# Patient Record
Sex: Female | Born: 1950
Health system: Southern US, Community
[De-identification: ages and names within clinical notes are randomized; demographics above are authoritative.]

## PROBLEM LIST (undated history)

## (undated) DIAGNOSIS — D649 Anemia, unspecified: Secondary | ICD-10-CM

## (undated) DIAGNOSIS — N939 Abnormal uterine and vaginal bleeding, unspecified: Secondary | ICD-10-CM

## (undated) DIAGNOSIS — N6452 Nipple discharge: Secondary | ICD-10-CM

## (undated) DIAGNOSIS — R7611 Nonspecific reaction to tuberculin skin test without active tuberculosis: Secondary | ICD-10-CM

## (undated) DIAGNOSIS — M8580 Other specified disorders of bone density and structure, unspecified site: Secondary | ICD-10-CM

## (undated) DIAGNOSIS — I499 Cardiac arrhythmia, unspecified: Secondary | ICD-10-CM

## (undated) DIAGNOSIS — E785 Hyperlipidemia, unspecified: Secondary | ICD-10-CM

## (undated) DIAGNOSIS — Z9289 Personal history of other medical treatment: Secondary | ICD-10-CM

## (undated) DIAGNOSIS — D219 Benign neoplasm of connective and other soft tissue, unspecified: Secondary | ICD-10-CM

## (undated) DIAGNOSIS — M1990 Unspecified osteoarthritis, unspecified site: Secondary | ICD-10-CM

## (undated) DIAGNOSIS — D0330 Melanoma in situ of unspecified part of face: Secondary | ICD-10-CM

## (undated) DIAGNOSIS — G5603 Carpal tunnel syndrome, bilateral upper limbs: Secondary | ICD-10-CM

## (undated) DIAGNOSIS — F419 Anxiety disorder, unspecified: Secondary | ICD-10-CM

## (undated) HISTORY — PX: SKIN BIOPSY: SHX1

## (undated) HISTORY — DX: Abnormal uterine and vaginal bleeding, unspecified: N93.9

## (undated) HISTORY — DX: Nipple discharge: N64.52

## (undated) HISTORY — DX: Other specified disorders of bone density and structure, unspecified site: M85.80

## (undated) HISTORY — PX: HERNIA REPAIR: SHX51

## (undated) HISTORY — DX: Benign neoplasm of connective and other soft tissue, unspecified: D21.9

## (undated) HISTORY — DX: Unspecified osteoarthritis, unspecified site: M19.90

## (undated) HISTORY — PX: TUBAL LIGATION: SHX77

## (undated) HISTORY — DX: Melanoma in situ of unspecified part of face: D03.30

## (undated) HISTORY — DX: Nonspecific reaction to tuberculin skin test without active tuberculosis: R76.11

## (undated) HISTORY — PX: CARPAL TUNNEL RELEASE: SHX101

## (undated) HISTORY — DX: Hyperlipidemia, unspecified: E78.5

## (undated) HISTORY — PX: DILATION AND CURETTAGE OF UTERUS: SHX78

## (undated) HISTORY — DX: Anemia, unspecified: D64.9

## (undated) HISTORY — DX: Personal history of other medical treatment: Z92.89

## (undated) HISTORY — DX: Anxiety disorder, unspecified: F41.9

## (undated) HISTORY — DX: Carpal tunnel syndrome, bilateral upper limbs: G56.03

## (undated) HISTORY — DX: Cardiac arrhythmia, unspecified: I49.9

---

## 1975-01-06 DIAGNOSIS — Z9289 Personal history of other medical treatment: Secondary | ICD-10-CM

## 1975-01-06 HISTORY — DX: Personal history of other medical treatment: Z92.89

## 1984-01-06 HISTORY — PX: BACK SURGERY: SHX140

## 1996-01-06 HISTORY — PX: LAPAROSCOPY: SHX197

## 1998-02-01 ENCOUNTER — Encounter: Payer: Self-pay | Admitting: *Deleted

## 1998-02-01 ENCOUNTER — Ambulatory Visit (HOSPITAL_COMMUNITY): Admission: RE | Admit: 1998-02-01 | Discharge: 1998-02-01 | Payer: Self-pay | Admitting: *Deleted

## 1999-02-12 ENCOUNTER — Other Ambulatory Visit: Admission: RE | Admit: 1999-02-12 | Discharge: 1999-02-12 | Payer: Self-pay | Admitting: *Deleted

## 2000-04-27 ENCOUNTER — Other Ambulatory Visit: Admission: RE | Admit: 2000-04-27 | Discharge: 2000-04-27 | Payer: Self-pay | Admitting: *Deleted

## 2001-10-19 ENCOUNTER — Encounter: Payer: Self-pay | Admitting: Family Medicine

## 2001-10-19 ENCOUNTER — Encounter: Admission: RE | Admit: 2001-10-19 | Discharge: 2001-10-19 | Payer: Self-pay | Admitting: Family Medicine

## 2002-02-13 ENCOUNTER — Ambulatory Visit (HOSPITAL_COMMUNITY): Admission: RE | Admit: 2002-02-13 | Discharge: 2002-02-13 | Payer: Self-pay | Admitting: Gastroenterology

## 2003-01-31 ENCOUNTER — Encounter: Admission: RE | Admit: 2003-01-31 | Discharge: 2003-01-31 | Payer: Self-pay | Admitting: Family Medicine

## 2003-02-05 ENCOUNTER — Other Ambulatory Visit: Admission: RE | Admit: 2003-02-05 | Discharge: 2003-02-05 | Payer: Self-pay | Admitting: Obstetrics and Gynecology

## 2004-04-30 ENCOUNTER — Other Ambulatory Visit: Admission: RE | Admit: 2004-04-30 | Discharge: 2004-04-30 | Payer: Self-pay | Admitting: *Deleted

## 2005-05-13 ENCOUNTER — Other Ambulatory Visit: Admission: RE | Admit: 2005-05-13 | Discharge: 2005-05-13 | Payer: Self-pay | Admitting: Obstetrics & Gynecology

## 2006-05-17 ENCOUNTER — Other Ambulatory Visit: Admission: RE | Admit: 2006-05-17 | Discharge: 2006-05-17 | Payer: Self-pay | Admitting: Obstetrics & Gynecology

## 2007-04-07 ENCOUNTER — Ambulatory Visit (HOSPITAL_COMMUNITY): Admission: RE | Admit: 2007-04-07 | Discharge: 2007-04-07 | Payer: Self-pay | Admitting: Family Medicine

## 2007-05-26 ENCOUNTER — Other Ambulatory Visit: Admission: RE | Admit: 2007-05-26 | Discharge: 2007-05-26 | Payer: Self-pay | Admitting: Obstetrics & Gynecology

## 2009-06-13 ENCOUNTER — Encounter: Admission: RE | Admit: 2009-06-13 | Discharge: 2009-06-13 | Payer: Self-pay | Admitting: Family Medicine

## 2010-05-23 NOTE — Op Note (Signed)
   NAME:  Kristy Haynes, Kristy Haynes                    ACCOUNT NO.:  [id number]   MEDICAL RECORD NO.:  [id number]                   PATIENT TYPE:  AMB   LOCATION:  ENDO                                 FACILITY:  [facility]   PHYSICIAN:  John C. Madilyn Fireman, M.D.                 DATE OF BIRTH:  Sep 25, 1950   DATE OF PROCEDURE:  02/13/2002  DATE OF DISCHARGE:                                 OPERATIVE REPORT   PROCEDURE:  Colonoscopy.   INDICATIONS FOR PROCEDURE:  Colon cancer screening in a 60 year old patient  with no previous screening.   DESCRIPTION OF PROCEDURE:  The patient was placed in the left lateral  decubitus position and placed on the pulse monitor with continuous low flow  oxygen delivered by nasal cannula. She was sedated with 75 micrograms of IV  Fentanyl and 8 mg of IV Versed.   The Olympus video colonoscope was inserted into the rectum and advanced to  the cecum, confirmed by transillumination of McBurney's point and  visualization of the ileocecal valve and appendiceal orifice.  The prep was  generally good but there were some areas where there was some semi-solid  material that could not be adequately lavaged and I could not rule out small  lesions less than  1 cm in all areas.   Otherwise the cecum appeared normal. There was a single ascending colon  diverticulum seen. Otherwise the ascending and  transverse colon appeared  normal. The proximal descending colon also appeared normal with no masses,  polyps, diverticula or other mucosal abnormalities.  Within the distal  descending and sigmoid colon, there were numerous diverticula. No other  abnormalities. The rectum appeared normal. Retroflex view of the anus  revealed no obvious internal hemorrhoids.   The colonoscope was then withdrawn and the patient was returned to the  recovery room in stable condition. She tolerated the procedure well and  there were no immediate complications.   IMPRESSION:  Diverticulosis, otherwise  normal colonoscopy.   PLAN:  Next colon screening by sigmoidoscopy in 5 years.                                                  John C. Madilyn Fireman, M.D.   [facility]/MEDQ  D:  02/13/2002  T:  02/13/2002  Job:  [id number]   cc:   Gretta Arab. Valentina Lucks, M.D.  [address] [place]  [place] [address]  Fax: [phone]

## 2012-10-02 ENCOUNTER — Other Ambulatory Visit: Payer: Self-pay | Admitting: Obstetrics & Gynecology

## 2012-10-03 ENCOUNTER — Telehealth: Payer: Self-pay

## 2012-10-03 NOTE — Telephone Encounter (Signed)
Pt had last aex 09-08-11 & no rx was given at that time. On 11-11-11 #30 with no refills given & on 03-10-12 #30 with 0 refills given. Pt does not have aex scheduled. Lmtcb to schedule aex & once scheduled then message will be routed to [facility] for approval or denial of request

## 2012-10-03 NOTE — Telephone Encounter (Signed)
Pt had last aex 09-08-11 & no rx was given at that time. On 11-11-11 #30 with no refills given & on 03-10-12 #30 with 0 refills given. Pt does not have aex scheduled. Lmtcb to schedule aex & once scheduled, message will be routed to [facility] for approval or denial of request

## 2012-10-04 NOTE — Telephone Encounter (Signed)
please call pt at work per pt when calling her work number hit [phone] then ask to have her paged

## 2012-10-04 NOTE — Telephone Encounter (Signed)
Spoke to patient. rx routed to [facility] for approval or denial

## 2012-10-04 NOTE — Telephone Encounter (Signed)
Pt scheduled aex with you for 10-20-12. Please approve or deny rx. Chart is in your door

## 2012-10-20 ENCOUNTER — Encounter: Payer: Self-pay | Admitting: Obstetrics & Gynecology

## 2012-10-20 ENCOUNTER — Ambulatory Visit (INDEPENDENT_AMBULATORY_CARE_PROVIDER_SITE_OTHER): Payer: BC Managed Care – PPO | Admitting: Obstetrics & Gynecology

## 2012-10-20 VITALS — BP 108/68 | HR 80 | Resp 16 | Ht 66.0 in | Wt 152.4 lb

## 2012-10-20 DIAGNOSIS — Z01419 Encounter for gynecological examination (general) (routine) without abnormal findings: Secondary | ICD-10-CM

## 2012-10-20 MED ORDER — ALPRAZOLAM 0.25 MG PO TABS: ORAL_TABLET | ORAL | Status: DC

## 2012-10-20 NOTE — Patient Instructions (Signed)

## 2012-10-20 NOTE — Progress Notes (Signed)
62 y.o. G3P3 DivorcedCaucasianF here for annual exam.  No vaginal bleeding.  See Dr. Valentina Lucks yearly for labs.   Patient's last menstrual period was 01/06/1999.          Sexually active: no  The current method of family planning is tubal ligation.    Exercising: yes  walking Smoker:  no  Health Maintenance: Pap:  09/10/11 WNL/negative HR HPV History of abnormal Pap:  no MMG:  12/24/11 normal Colonoscopy:  4/14 repeat in 10 years, Dr. Madilyn Fireman, f/u 10 yra. BMD: 2011 at [place], very mild osteopenia TDaP:  6/12 Screening Labs: PCP, Hb today: PCP, Urine today: PCP   reports that she has been smoking Cigarettes.  She has been smoking about 0.50 packs per day. She has never used smokeless tobacco. She reports that she drinks alcohol. She reports that she does not use illicit drugs.  Past Medical History  Diagnosis Date  . Osteopenia   . Irregular heart beat     echo/stress test  (neg) 02/2008  . Anemia   . Positive TB test     x-ray normal  . Abnormal uterine bleeding     x2 after childbirth x1  . Transfusion history 1977  . Fibroid   . Breast discharge     Bilateral 25 years ago  . Anxiety   . Elevated lipids   . Arthritis     right knee/using Voltaren gel  . Osteopenia     Past Surgical History  Procedure Laterality Date  . Tubal ligation    . Laparoscopy  1998  . Hernia repair Bilateral   . Laminectomy    . Back surgery  1986  . Dilation and curettage of uterus      x3    Current Outpatient Prescriptions  Medication Sig Dispense Refill  . ALPRAZolam (XANAX) 0.25 MG tablet TAKE 1 TABLET BY MOUTH EVERY DAY AS NEEDED FOR ANXIETY  30 tablet  0  . atorvastatin (LIPITOR) 20 MG tablet Take 20 mg by mouth daily.      . Calcium Carbonate Antacid (TUMS PO) Take by mouth.      . Multiple Vitamin (MULTI-VITAMIN PO) Take by mouth daily.      . VOLTAREN 1 % GEL        No current facility-administered medications for this visit.    Family History  Problem Relation Age of Onset   . Heart attack Maternal Grandmother     ROS:  Pertinent items are noted in HPI.  Otherwise, a comprehensive ROS was negative.  Exam:   BP 108/68  Pulse 80  Resp 16  Ht 5\' 6"  (1.676 m)  Wt 152 lb 6.4 oz (69.128 kg)  BMI 24.61 kg/m2  LMP 01/06/1999  Weight change: -3lbs   Height: 5\' 6"  (167.6 cm)  Ht Readings from Last 3 Encounters:  10/20/12 5\' 6"  (1.676 m)    General appearance: alert, cooperative and appears stated age Head: Normocephalic, without obvious abnormality, atraumatic Neck: no adenopathy, supple, symmetrical, trachea midline and thyroid normal to inspection and palpation Lungs: clear to auscultation bilaterally Breasts: normal appearance, no masses or tenderness Heart: regular rate and rhythm Abdomen: soft, non-tender; bowel sounds normal; no masses,  no organomegaly Extremities: extremities normal, atraumatic, no cyanosis or edema Skin: Skin color, texture, turgor normal. No rashes or lesions Lymph nodes: Cervical, supraclavicular, and axillary nodes normal. No abnormal inguinal nodes palpated Neurologic: Grossly normal   Pelvic: External genitalia:  no lesions  Urethra:  normal appearing urethra with no masses, tenderness or lesions              Bartholins and Skenes: normal                 Vagina: normal appearing vagina with normal color and discharge, no lesions              Cervix: no lesions              Pap taken: no Bimanual Exam:  Uterus:  normal size, contour, position, consistency, mobility, non-tender              Adnexa: normal adnexa and no mass, fullness, tenderness               Rectovaginal: Confirms               Anus:  normal sphincter tone, no lesions  A:  Well Woman with normal exam PMP, no HRT H/O anxiety Mild osteopenia 8# weight loss in 2 year  P:   Mammogram yearly pap smear with neg HR HPV, 9/13 Xanax 0.25mg  prn.  #30/3RF.  Usually uses 2 RF.  Will need again in six months. return annually or prn  An After Visit  Summary was printed and given to the patient.

## 2013-03-01 ENCOUNTER — Encounter: Payer: Self-pay | Admitting: Obstetrics & Gynecology

## 2013-10-28 ENCOUNTER — Other Ambulatory Visit: Payer: Self-pay | Admitting: Obstetrics & Gynecology

## 2013-10-30 NOTE — Telephone Encounter (Signed)
Last AEX and refill 10/20/12 #30/3R Next appt 11/13/13  Please advise

## 2013-11-01 NOTE — Telephone Encounter (Signed)
Rx has been faxed Encounter closed

## 2013-11-06 ENCOUNTER — Encounter: Payer: Self-pay | Admitting: Obstetrics & Gynecology

## 2013-11-13 ENCOUNTER — Encounter: Payer: Self-pay | Admitting: Obstetrics & Gynecology

## 2013-11-13 ENCOUNTER — Ambulatory Visit (INDEPENDENT_AMBULATORY_CARE_PROVIDER_SITE_OTHER): Payer: 59 | Admitting: Obstetrics & Gynecology

## 2013-11-13 VITALS — BP 108/62 | HR 64 | Resp 16 | Ht 66.0 in | Wt 150.2 lb

## 2013-11-13 DIAGNOSIS — Z124 Encounter for screening for malignant neoplasm of cervix: Secondary | ICD-10-CM

## 2013-11-13 DIAGNOSIS — Z01419 Encounter for gynecological examination (general) (routine) without abnormal findings: Secondary | ICD-10-CM

## 2013-11-13 NOTE — Progress Notes (Signed)
63 y.o. G3P3 DivorcedCaucasianF here for annual exam.  No vaginal bleeding.  Doing well.  Dr. Laurann Montana is PCP.   Last visit was in September.  Reports I gave her a "cream" several years ago that really helped with occasional skin itching.  She used it so infrequently that is has lasted for year.  I have no documentation of this in current EPIC or paper chart.  She's "sure" it was me who gave it.  Patient's last menstrual period was 01/06/1999.          Sexually active: No.  The current method of family planning is tubal ligation.    Exercising: Yes.    walking Smoker:  Yes 1/2 PPD  Health Maintenance: Pap:  09/10/11 WNL/negative HR HPV History of abnormal Pap:  no MMG:  01/25/13-MMG, 02/15/13-repeat films-normal Colonoscopy:  2014-repeat in 10 years, Dr. Amedeo Plenty BMD:   2011-very mild osteopenia TDaP:  2012 Screening Labs: PCP, Hb today: PCP, Urine today: PCP   reports that she has been smoking Cigarettes.  She has been smoking about 0.50 packs per day. She has never used smokeless tobacco. She reports that she drinks alcohol. She reports that she does not use illicit drugs.  Past Medical History  Diagnosis Date  . Osteopenia   . Irregular heart beat     echo/stress test  (neg) 02/2008  . Anemia   . Positive TB test     x-ray normal  . Abnormal uterine bleeding     x2 after childbirth x1  . Transfusion history 1977  . Fibroid   . Breast discharge     Bilateral 25 years ago  . Anxiety   . Elevated lipids   . Arthritis     right knee/using Voltaren gel  . Osteopenia     Past Surgical History  Procedure Laterality Date  . Tubal ligation    . Laparoscopy  1998  . Hernia repair Bilateral   . Back surgery  1986  . Dilation and curettage of uterus      x3  . Skin biopsy      on nose    Current Outpatient Prescriptions  Medication Sig Dispense Refill  . ALPRAZolam (XANAX) 0.25 MG tablet TAKE 1 TABLET EVERY DAY AS NEEDED FOR ANXIETY 30 tablet 0  . atorvastatin (LIPITOR) 20 MG  tablet Take 20 mg by mouth daily.    . Calcium Carbonate Antacid (TUMS PO) Take by mouth.    . mometasone (ELOCON) 0.1 % cream   0  . Multiple Vitamin (MULTI-VITAMIN PO) Take by mouth daily.     No current facility-administered medications for this visit.    Family History  Problem Relation Age of Onset  . Heart attack Maternal Grandmother     ROS:  Pertinent items are noted in HPI.  Otherwise, a comprehensive ROS was negative.  Exam:   BP 108/62 mmHg  Pulse 64  Resp 16  Ht 5\' 6"  (1.676 m)  Wt 150 lb 3.2 oz (68.13 kg)  BMI 24.25 kg/m2  LMP 01/06/1999  Weight change: -2#  Height: 5\' 6"  (167.6 cm)  Ht Readings from Last 3 Encounters:  11/13/13 5\' 6"  (1.676 m)  10/20/12 5\' 6"  (1.676 m)    General appearance: alert, cooperative and appears stated age Head: Normocephalic, without obvious abnormality, atraumatic Neck: no adenopathy, supple, symmetrical, trachea midline and thyroid normal to inspection and palpation Lungs: clear to auscultation bilaterally Breasts: normal appearance, no masses or tenderness Heart: regular rate and rhythm Abdomen: soft,  non-tender; bowel sounds normal; no masses,  no organomegaly Extremities: extremities normal, atraumatic, no cyanosis or edema Skin: Skin color, texture, turgor normal. No rashes or lesions Lymph nodes: Cervical, supraclavicular, and axillary nodes normal. No abnormal inguinal nodes palpated Neurologic: Grossly normal   Pelvic: External genitalia:  no lesions              Urethra:  normal appearing urethra with no masses, tenderness or lesions              Bartholins and Skenes: normal                 Vagina: normal appearing vagina with normal color and discharge, no lesions              Cervix: no lesions              Pap taken: Yes.   Bimanual Exam:  Uterus:  normal size, contour, position, consistency, mobility, non-tender              Adnexa: normal adnexa and no mass, fullness, tenderness               Rectovaginal:  Confirms               Anus:  normal sphincter tone, no lesions  A:  Well Woman with normal exam PMP, no HRT H/O anxiety Mild osteopenia 10# weight loss in 3 year  P: Mammogram yearly pap smear with neg HR HPV, 9/13.  Pap today.  Xanax 0.25mg  prn. #30/0RF done 10/31/13.  Usually pt receives three in a year Cutivate cream given.  Instructions for use provided.  Pt isn't to use for more than 5 days at a time.  Voices understanding. return annually or prn F/U 1 year or PRN  An After Visit Summary was printed and given to the patient.

## 2013-11-15 LAB — IPS PAP TEST WITH REFLEX TO HPV

## 2013-11-16 ENCOUNTER — Telehealth: Payer: Self-pay | Admitting: Obstetrics & Gynecology

## 2013-11-16 NOTE — Telephone Encounter (Signed)
Patient calling re: "mild steroid for face was not called in" after her last visit with Dr. Sabra Heck on 11/13/13.  [id number] - [place], [place] - [address] AT [address]

## 2013-11-16 NOTE — Telephone Encounter (Signed)
Please advise  This is the pt kelly called the pharmacy to see past rx cream [facility]

## 2013-11-17 MED ORDER — FLUTICASONE PROPIONATE 0.05 % EX CREA: TOPICAL_CREAM | Freq: Two times a day (BID) | CUTANEOUS | Status: DC

## 2013-11-17 NOTE — Telephone Encounter (Signed)
rx for cutivate cream 0.05% bid up to 7 days sent electronically to pharmacy.  Encounter will be closed.

## 2014-08-29 DIAGNOSIS — D0330 Melanoma in situ of unspecified part of face: Secondary | ICD-10-CM | POA: Insufficient documentation

## 2015-01-07 ENCOUNTER — Other Ambulatory Visit: Payer: Self-pay | Admitting: Obstetrics & Gynecology

## 2015-01-08 NOTE — Telephone Encounter (Signed)
Medication refill request: Xanax  Last AEX:  11/13/13 MSM Next AEX: 01/18/2015 MSM Last MMG (if hormonal medication request): 09/05/14 BIRADS category 2 Benign Refill authorized: 10/31/13 Xanax 0.25 mg #30 tabs 0 Refills  Today: #30 tabs 0 Refills Please advise

## 2015-01-08 NOTE — Telephone Encounter (Signed)
[facility]

## 2015-01-18 ENCOUNTER — Ambulatory Visit (INDEPENDENT_AMBULATORY_CARE_PROVIDER_SITE_OTHER): Payer: 59 | Admitting: Obstetrics & Gynecology

## 2015-01-18 ENCOUNTER — Encounter: Payer: Self-pay | Admitting: Obstetrics & Gynecology

## 2015-01-18 VITALS — BP 120/60 | HR 86 | Resp 16 | Ht 65.75 in | Wt 136.0 lb

## 2015-01-18 DIAGNOSIS — Z01419 Encounter for gynecological examination (general) (routine) without abnormal findings: Secondary | ICD-10-CM | POA: Diagnosis not present

## 2015-01-18 NOTE — Progress Notes (Signed)
65 y.o. G3P3 DivorcedCaucasianF here for annual exam.  Pt has actively worked on weight loss.  Down another 14#.  She reports she is on a lower Lipitor dosage.    D/W pt Hep C testing.  Gives blood so doesn't need this done today.    PCP:  Dr. Kelton Pillar  Patient's last menstrual period was 01/06/1999.          Sexually active: No.  The current method of family planning is post menopausal status.    Exercising: Yes.    Walking Smoker:  yes  Health Maintenance: Pap:  11/13/13 Neg, 09/10/11 neg pap with neg HR HPV History of abnormal Pap:  no MMG:  09/05/14 BIRADS1:neg  Colonoscopy:  2014 repeat 10 years  BMD:   2011 mild osteopenia. Has one scheduled in march  TDaP:  2012 Screening Labs: PCP, Hb today: PCP, Urine today: PCP   reports that she has been smoking Cigarettes.  She has been smoking about 0.50 packs per day. She has never used smokeless tobacco. She reports that she drinks alcohol. She reports that she does not use illicit drugs.  Past Medical History  Diagnosis Date  . Osteopenia   . Irregular heart beat     echo/stress test  (neg) 02/2008  . Anemia   . Positive TB test     x-ray normal  . Abnormal uterine bleeding     x2 after childbirth x1  . Transfusion history 1977  . Fibroid   . Breast discharge     Bilateral 25 years ago  . Anxiety   . Elevated lipids   . Arthritis     right knee/using Voltaren gel  . Osteopenia   . Melanoma in situ of face ([place])     nose    Past Surgical History  Procedure Laterality Date  . Tubal ligation    . Laparoscopy  1998  . Hernia repair Bilateral   . Back surgery  1986  . Dilation and curettage of uterus      x3  . Skin biopsy      on nose    Current Outpatient Prescriptions  Medication Sig Dispense Refill  . ALPRAZolam (XANAX) 0.25 MG tablet TAKE 1 TABLET EVERY DAY AS NEEDED FOR ANXIETY 30 tablet 0  . atorvastatin (LIPITOR) 20 MG tablet Take 10 mg by mouth daily.     . Multiple Vitamin (MULTI-VITAMIN PO) Take by  mouth daily.    . Calcium Carbonate Antacid (TUMS PO) Take by mouth. Reported on 01/18/2015     No current facility-administered medications for this visit.    Family History  Problem Relation Age of Onset  . Heart attack Maternal Grandmother     ROS:  Pertinent items are noted in HPI.  Otherwise, a comprehensive ROS was negative.  Exam:   BP 120/60 mmHg  Pulse 86  Resp 16  Ht 5' 5.75" (1.67 m)  Wt 136 lb (61.689 kg)  BMI 22.12 kg/m2  LMP 01/06/1999  Weight change: -14#   Height: 5' 5.75" (167 cm)  Ht Readings from Last 3 Encounters:  01/18/15 5' 5.75" (1.67 m)  11/13/13 5\' 6"  (1.676 m)  10/20/12 5\' 6"  (1.676 m)   General appearance: alert, cooperative and appears stated age Head: Normocephalic, without obvious abnormality, atraumatic Neck: no adenopathy, supple, symmetrical, trachea midline and thyroid normal to inspection and palpation Lungs: clear to auscultation bilaterally Breasts: normal appearance, no masses or tenderness Heart: regular rate and rhythm Abdomen: soft, non-tender; bowel sounds  normal; no masses,  no organomegaly Extremities: extremities normal, atraumatic, no cyanosis or edema Skin: Skin color, texture, turgor normal. No rashes or lesions Lymph nodes: Cervical, supraclavicular, and axillary nodes normal. No abnormal inguinal nodes palpated Neurologic: Grossly normal   Pelvic: External genitalia:  no lesions              Urethra:  normal appearing urethra with no masses, tenderness or lesions              Bartholins and Skenes: normal                 Vagina: normal appearing vagina with normal color and discharge, no lesions              Cervix: no lesions              Pap taken: No. Bimanual Exam:  Uterus:  normal size, contour, position, consistency, mobility, non-tender              Adnexa: normal adnexa and no mass, fullness, tenderness               Rectovaginal: Confirms               Anus:  normal sphincter tone, no lesions  Chaperone was  present for exam.  A:  Well Woman with normal exam PMP, no HRT H/O anxiety Mild osteopenia Intentional weight loss Melanoma in situ treated at [facility].  Will have follow up every three months this year.  P: Mammogram yearly pap smear with neg HR HPV, 9/13. Neg pap 2015.  No pap today. Xanax 0.25mg  prn. This was refilled 01/08/15.  She uses when she flies.  No rx needed. F/U 1 year or PRN

## 2015-01-30 ENCOUNTER — Other Ambulatory Visit: Payer: Self-pay

## 2015-01-30 ENCOUNTER — Ambulatory Visit (INDEPENDENT_AMBULATORY_CARE_PROVIDER_SITE_OTHER): Payer: 59 | Admitting: Family Medicine

## 2015-01-30 ENCOUNTER — Ambulatory Visit (INDEPENDENT_AMBULATORY_CARE_PROVIDER_SITE_OTHER)
Admission: RE | Admit: 2015-01-30 | Discharge: 2015-01-30 | Disposition: A | Discharge status: Home / Self Care | Payer: 59 | Source: Ambulatory Visit | Attending: Family Medicine | Admitting: Family Medicine

## 2015-01-30 ENCOUNTER — Ambulatory Visit: Payer: Self-pay | Admitting: Family Medicine

## 2015-01-30 ENCOUNTER — Other Ambulatory Visit: Payer: 59

## 2015-01-30 ENCOUNTER — Other Ambulatory Visit (INDEPENDENT_AMBULATORY_CARE_PROVIDER_SITE_OTHER): Payer: 59

## 2015-01-30 ENCOUNTER — Encounter: Payer: Self-pay | Admitting: Family Medicine

## 2015-01-30 VITALS — BP 118/78 | HR 73 | Wt 137.0 lb

## 2015-01-30 DIAGNOSIS — M25561 Pain in right knee: Secondary | ICD-10-CM

## 2015-01-30 DIAGNOSIS — M25361 Other instability, right knee: Secondary | ICD-10-CM | POA: Diagnosis not present

## 2015-01-30 DIAGNOSIS — M1711 Unilateral primary osteoarthritis, right knee: Secondary | ICD-10-CM | POA: Diagnosis not present

## 2015-01-30 DIAGNOSIS — M6740 Ganglion, unspecified site: Secondary | ICD-10-CM

## 2015-01-30 DIAGNOSIS — M2351 Chronic instability of knee, right knee: Secondary | ICD-10-CM

## 2015-01-30 DIAGNOSIS — M2350 Chronic instability of knee, unspecified knee: Secondary | ICD-10-CM | POA: Insufficient documentation

## 2015-01-30 NOTE — Progress Notes (Signed)
[facility] [address] [place], [place] [address] Phone: [phone] Subjective:    I'm seeing this patient by the request  of:  Osborne Casco, MD   CC: Right knee pain   [id number] MALLEY ESCOBAR is a 65 y.o. female coming in with complaint of right knee pain. Patient states that she has had pain for approximately 4 years. Seems to be worsening. Having limited range of motion now. States that going downstairs seems to be worse. States that she has noticed that she is also has muscle weakness on this side. Denies any fevers chills or any abnormal weight loss. States though that that does keep her from doing different activities. Rates the severity of discomfort as 4 out of 10. States that the pain is only with certain activities though. No pain at rest or at night. Patient has not tried any home modalities except for over-the-counter medications which is mildly improved     Past Medical History  Diagnosis Date  . Osteopenia   . Irregular heart beat     echo/stress test  (neg) 02/2008  . Anemia   . Positive TB test     x-ray normal  . Abnormal uterine bleeding     x2 after childbirth x1  . Transfusion history 1977  . Fibroid   . Breast discharge     Bilateral 25 years ago  . Anxiety   . Elevated lipids   . Arthritis     right knee/using Voltaren gel  . Osteopenia   . Melanoma in situ of face ([place])     nose   Past Surgical History  Procedure Laterality Date  . Tubal ligation    . Laparoscopy  1998  . Hernia repair Bilateral   . Back surgery  1986  . Dilation and curettage of uterus      x3  . Skin biopsy      on nose   Social History   Social History  . Marital Status: Divorced    Spouse Name: N/A  . Number of Children: N/A  . Years of Education: N/A   Social History Main Topics  . Smoking status: Current Every Day Smoker -- 0.50 packs/day    Types: Cigarettes  . Smokeless tobacco: Never Used  . Alcohol Use:  0.0 oz/week    0 Standard drinks or equivalent per week     Comment: rarely  . Drug Use: No  . Sexual Activity: No     Comment: BTL   Other Topics Concern  . None   Social History Narrative   Allergies  Allergen Reactions  . Codeine   . Demerol [Meperidine]    Family History  Problem Relation Age of Onset  . Heart attack Maternal Grandmother     Past medical history, social, surgical and family history all reviewed in electronic medical record.  No pertanent information unless stated regarding to the chief complaint.   Review of Systems: No headache, visual changes, nausea, vomiting, diarrhea, constipation, dizziness, abdominal pain, skin rash, fevers, chills, night sweats, weight loss, swollen lymph nodes, body aches, joint swelling, muscle aches, chest pain, shortness of breath, mood changes.   Objective Blood pressure 118/78, pulse 73, weight 137 lb (62.143 kg), last menstrual period 01/06/1999, SpO2 98 %.  General: No apparent distress alert and oriented x3 mood and affect normal, dressed appropriately.  HEENT: Pupils equal, extraocular movements intact  Respiratory: Patient's speak in full sentences and does not appear short of  breath  Cardiovascular: No lower extremity edema, non tender, no erythema  Skin: Warm dry intact with no signs of infection or rash on extremities or on axial skeleton.  Abdomen: Soft nontender  Neuro: Cranial nerves II through XII are intact, neurovascularly intact in all extremities with 2+ DTRs and 2+ pulses.  Lymph: No lymphadenopathy of posterior or anterior cervical chain or axillae bilaterally.  Gait normal with good balance and coordination.  MSK:  Non tender with full range of motion and good stability and symmetric strength and tone of shoulders, elbows, wrist, hip, and ankles bilaterally. Mild arthritic changes of multiple joints Knee: Right Patient does have some atrophy as well as a joint effusion noted Patient does have what appears  to be crepitus-like sound when pushed on fluid. Patient is minimally tender to palpation mostly over the medial joint line Lacks last 10 of flexion Ligaments with solid consistent endpoints including ACL, PCL, LCL, MCL. Mild positive Mcmurray's, Apley's, and Thessalonian tests. Non painful patellar compression. Patellar glide with mild crepitus. Patellar and quadriceps tendons unremarkable. Hamstring and quadriceps strength is normal.  Contralateral leg unremarkable  MSK US performed of: Right knee This study was ordered, performed, and interpreted by Charlann Boxer D.O.  Knee: All structures visualized. Significant effusion noted bursa possible myositis or cystic formation within the knee itself. Difficult to assess. Chronic degenerative change with mild displacement of the posterior lateral meniscus. Patient also has some mild degenerative changes of the medial meniscus but no displacement. Mild to moderate arthritic changes of the joint space Patellar Tendon unremarkable on long and transverse views without effusion. No abnormality of prepatellar bursa. LCL and MCL unremarkable on long and transverse views. No abnormality of origin of medial or lateral head of the gastrocnemius.  IMPRESSION:  Right knee with moderate arthritis, chronic meniscal tear, as well as effusion  Procedure: Real-time Ultrasound Guided Injection of right knee Device: GE Logiq E  Ultrasound guided injection is preferred based studies that show increased duration, increased effect, greater accuracy, decreased procedural pain, increased response rate, and decreased cost with ultrasound guided versus blind injection.  Verbal informed consent obtained.  Time-out conducted.  Noted no overlying erythema, induration, or other signs of local infection.  Skin prepped in a sterile fashion.  Local anesthesia: Topical Ethyl chloride.  With sterile technique and under real time ultrasound guidance: With a 22-gauge 2 inch  needle patient was injected with 4 cc of 0.5% Marcaine and aspirated on total of 30 mL of jellylike fluid then does have strawlike colored. Then injected 1 cc of Kenalog 40 mg/dL. This was from a superior lateral approach.  Completed without difficulty  Pain immediately resolved suggesting accurate placement of the medication.  Advised to call if fevers/chills, erythema, induration, drainage, or persistent bleeding.  Images permanently stored and available for review in the ultrasound unit.  Impression: Technically successful ultrasound guided injection.     Impression and Recommendations:     This case required medical decision making of moderate complexity.      Note: This dictation was prepared with Dragon dictation along with smaller phrase technology. Any transcriptional errors that result from this process are unintentional.

## 2015-01-30 NOTE — Assessment & Plan Note (Signed)
Patient did have what appeared to be a ganglion cyst in the knee. Aspiration was attempted and 30 mL of material was removed. Hopefully that this will not reactively quickly. We discussed that there was significant much more left. We may need to consider repeat aspiration. Patient come back again in 3 weeks. It is large enough that patient could consider surgical intervention. Any intra-articular pathology or instability occurs of the knee advance imaging may be warranted. X-rays are pending.

## 2015-01-30 NOTE — Patient Instructions (Addendum)
Good to see you You have moderate arthritis, chronic meniscal tear and a large ganglion cyst.  Ice 20 minutes 2 times daily. Usually after activity and before bed. Exercises 3 times a week.  Wear the compression sleeve when doing a lot of activity  pennsaid pinkie amount topically 2 times daily as needed.  Xray downstairs today  See me again in 3 weeks.

## 2015-01-30 NOTE — Assessment & Plan Note (Signed)
Patient did have a chronic meniscal tear but also had a space occupying cyst that appears to be ganglion in nature. We are sending the material to lab. X-rays are pending. Patient did have increased range of motion and decreased pain immediately. Patient states best it felted greater than 4 years. We discussed icing regimen, compression sleeve, and patient had home exercises. Patient will come back and see me again in 3 weeks. We may have to do another aspiration at that time. Significant instability or any abnormality on the x-ray and possibly advance imaging may be warranted.

## 2015-01-31 LAB — SYNOVIAL CELL COUNT + DIFF, W/ CRYSTALS
Eosinophils-Synovial: 3 % — ABNORMAL HIGH (ref 0–1)
Lymphocytes-Synovial Fld: 27 % — ABNORMAL HIGH (ref 0–20)
Monocyte/Macrophage: 27 % — ABNORMAL LOW (ref 50–90)
NEUTROPHIL, SYNOVIAL: 42 % — AB (ref 0–25)

## 2015-02-20 ENCOUNTER — Ambulatory Visit (INDEPENDENT_AMBULATORY_CARE_PROVIDER_SITE_OTHER): Payer: 59 | Admitting: Family Medicine

## 2015-02-20 ENCOUNTER — Other Ambulatory Visit (INDEPENDENT_AMBULATORY_CARE_PROVIDER_SITE_OTHER): Payer: 59

## 2015-02-20 ENCOUNTER — Encounter: Payer: Self-pay | Admitting: Family Medicine

## 2015-02-20 VITALS — BP 122/72 | HR 71 | Ht 65.75 in | Wt 136.0 lb

## 2015-02-20 DIAGNOSIS — M6740 Ganglion, unspecified site: Secondary | ICD-10-CM

## 2015-02-20 DIAGNOSIS — M1711 Unilateral primary osteoarthritis, right knee: Secondary | ICD-10-CM

## 2015-02-20 DIAGNOSIS — M25861 Other specified joint disorders, right knee: Secondary | ICD-10-CM | POA: Diagnosis not present

## 2015-02-20 DIAGNOSIS — M25361 Other instability, right knee: Secondary | ICD-10-CM

## 2015-02-20 DIAGNOSIS — M2351 Chronic instability of knee, right knee: Secondary | ICD-10-CM

## 2015-02-20 DIAGNOSIS — R2241 Localized swelling, mass and lump, right lower limb: Secondary | ICD-10-CM

## 2015-02-20 DIAGNOSIS — R2240 Localized swelling, mass and lump, unspecified lower limb: Secondary | ICD-10-CM | POA: Insufficient documentation

## 2015-02-20 NOTE — Progress Notes (Signed)
Pre visit review using our clinic review tool, if applicable. No additional management support is needed unless otherwise documented below in the visit note. 

## 2015-02-20 NOTE — Progress Notes (Signed)
[name] [place], [place] [address] Phone: [phone] Subjective:    I'm seeing this patient by the request  of:  Osborne Casco, MD   CC: Right knee pain follow-up   [id number] Kristy Haynes is a 65 y.o. female coming in with complaint of right knee pain. Patient was seen 3 weeks ago before she would out of town. Was found to have a ganglion cyst it appeared in her left knee. Patient states for approximate 10 day she was completely pain-free and had full range of motion. With her walking a long distance though she started having increasing swelling again. States that it seemed to be giving her more pain. Seems to be even worse at night. Patient did have x-rays at last follow-up that didn't correspond with moderate arthritic changes. Patient states that it seems to be as severe as it was before the injection and aspiration. No new symptoms is worsening a previous symptoms.     Past Medical History  Diagnosis Date  . Osteopenia   . Irregular heart beat     echo/stress test  (neg) 02/2008  . Anemia   . Positive TB test     x-ray normal  . Abnormal uterine bleeding     x2 after childbirth x1  . Transfusion history 1977  . Fibroid   . Breast discharge     Bilateral 25 years ago  . Anxiety   . Elevated lipids   . Arthritis     right knee/using Voltaren gel  . Osteopenia   . Melanoma in situ of face ([place])     nose   Past Surgical History  Procedure Laterality Date  . Tubal ligation    . Laparoscopy  1998  . Hernia repair Bilateral   . Back surgery  1986  . Dilation and curettage of uterus      x3  . Skin biopsy      on nose   Social History   Social History  . Marital Status: Divorced    Spouse Name: N/A  . Number of Children: N/A  . Years of Education: N/A   Social History Main Topics  . Smoking status: Current Every Day Smoker -- 0.50 packs/day    Types: Cigarettes  . Smokeless tobacco: Never Used   . Alcohol Use: 0.0 oz/week    0 Standard drinks or equivalent per week     Comment: rarely  . Drug Use: No  . Sexual Activity: No     Comment: BTL   Other Topics Concern  . None   Social History Narrative   Allergies  Allergen Reactions  . Codeine   . Demerol [Meperidine]    Family History  Problem Relation Age of Onset  . Heart attack Maternal Grandmother     Past medical history, social, surgical and family history all reviewed in electronic medical record.  No pertanent information unless stated regarding to the chief complaint.   Review of Systems: No headache, visual changes, nausea, vomiting, diarrhea, constipation, dizziness, abdominal pain, skin rash, fevers, chills, night sweats, weight loss, swollen lymph nodes, body aches, joint swelling, muscle aches, chest pain, shortness of breath, mood changes.   Objective Blood pressure 122/72, pulse 71, height 5' 5.75" (1.67 m), weight 136 lb (61.689 kg), last menstrual period 01/06/1999, SpO2 99 %.  General: No apparent distress alert and oriented x3 mood and affect normal, dressed appropriately.  HEENT: Pupils equal, extraocular movements intact  Respiratory: Patient's  speak in full sentences and does not appear short of breath  Cardiovascular: No lower extremity edema, non tender, no erythema  Skin: Warm dry intact with no signs of infection or rash on extremities or on axial skeleton.  Abdomen: Soft nontender  Neuro: Cranial nerves II through XII are intact, neurovascularly intact in all extremities with 2+ DTRs and 2+ pulses.  Lymph: No lymphadenopathy of posterior or anterior cervical chain or axillae bilaterally.  Gait normal with good balance and coordination.  MSK:  Non tender with full range of motion and good stability and symmetric strength and tone of shoulders, elbows, wrist, hip, and ankles bilaterally. Mild arthritic changes of multiple joints Knee: Right Patient does have some atrophy as well as a joint  effusion noted possibly worse than previous exam Patient does have what appears to be crepitus-like sound when pushed on fluid. Patient is minimally tender to palpation mostly over the medial joint line Lacks last 10 of flexion Ligaments with solid consistent endpoints including ACL, PCL, LCL, MCL. Mild positive Mcmurray's, Apley's, and Thessalonian tests. Non painful patellar compression. Patellar glide with mild crepitus. Patellar and quadriceps tendons unremarkable. Hamstring and quadriceps strength is normal.  Contralateral leg unremarkable   Procedure: Real-time Ultrasound Guided Injection of right knee Device: GE Logiq E  Ultrasound guided injection is preferred based studies that show increased duration, increased effect, greater accuracy, decreased procedural pain, increased response rate, and decreased cost with ultrasound guided versus blind injection.  Verbal informed consent obtained.  Time-out conducted.  Noted no overlying erythema, induration, or other signs of local infection.  Skin prepped in a sterile fashion.  Local anesthesia: Topical Ethyl chloride.  With sterile technique and under real time ultrasound guidance: With a 22-gauge 2 inch needle patient was injected with 4 cc of 0.5% Marcaine and aspirated on total of 40 mL of jellylike fluid then does have strawlike colored with mild bloodtinged.  .  This was from a superior lateral approach.  Completed without difficulty  Pain immediately resolved suggesting accurate placement of the medication.  Advised to call if fevers/chills, erythema, induration, drainage, or persistent bleeding.  Images permanently stored and available for review in the ultrasound unit.  Impression: Technically successful ultrasound guided injection.     Impression and Recommendations:     This case required medical decision making of moderate complexity.      Note: This dictation was prepared with Dragon dictation along with smaller  phrase technology. Any transcriptional errors that result from this process are unintentional.

## 2015-02-20 NOTE — Patient Instructions (Addendum)
Good to see you  Ice is your friend Try the compression  pennsaid pinkie amount topically 2 times daily as needed.  USe this on your thumb and the shoulder We will get MRI of the knee with and without contrast.  I think it will give [place] good information.  If we get the MRI  See me again 1-2 days after.

## 2015-02-20 NOTE — Assessment & Plan Note (Signed)
Patient does have moderate arthritis but I do feel that advance imaging is worn 10 with the amount of increase in size of the cyst today. Patient does have a history of melanoma as well as a face. Likely though this is not but I do want to make sure that there is no space-occupying lesion. Patient had significant amount of enlargement. Patient also has now with seems to be a hard nonmovable nontender mass on the medial aspect of the knee that is new from previous exam. This does need further evaluation in with an without contrast was given. Patient and will follow-up with me again after the MRI and we'll discuss would be the best treatment. Patient stated that surgical intervention is necessary would consider Dr. Lorre Nick

## 2015-03-02 ENCOUNTER — Ambulatory Visit
Admission: RE | Admit: 2015-03-02 | Discharge: 2015-03-02 | Disposition: A | Discharge status: Home / Self Care | Payer: 59 | Source: Ambulatory Visit | Attending: Family Medicine | Admitting: Family Medicine

## 2015-03-02 DIAGNOSIS — M2351 Chronic instability of knee, right knee: Secondary | ICD-10-CM

## 2015-03-02 DIAGNOSIS — M6740 Ganglion, unspecified site: Secondary | ICD-10-CM

## 2015-03-02 MED ORDER — GADOBENATE DIMEGLUMINE 529 MG/ML IV SOLN
12.0000 mL | Freq: Once | INTRAVENOUS | Status: AC | PRN
  Administered 2015-03-02: 12 mL via INTRAVENOUS

## 2015-03-05 ENCOUNTER — Ambulatory Visit: Payer: 59 | Admitting: Family Medicine

## 2015-03-06 ENCOUNTER — Other Ambulatory Visit: Payer: Self-pay

## 2015-03-06 ENCOUNTER — Other Ambulatory Visit (INDEPENDENT_AMBULATORY_CARE_PROVIDER_SITE_OTHER): Payer: 59

## 2015-03-06 DIAGNOSIS — M25561 Pain in right knee: Secondary | ICD-10-CM | POA: Diagnosis not present

## 2015-03-06 LAB — C-REACTIVE PROTEIN: CRP: 0.1 mg/dL — AB (ref 0.5–20.0)

## 2015-03-06 LAB — SEDIMENTATION RATE: SED RATE: 19 mm/h (ref 0–22)

## 2015-03-07 LAB — CYCLIC CITRUL PEPTIDE ANTIBODY, IGG: Cyclic Citrullin Peptide Ab: <16 Units

## 2015-03-07 LAB — ANGIOTENSIN CONVERTING ENZYME: ANGIOTENSIN-CONVERTING ENZYME: 42 U/L (ref 8–52)

## 2015-03-07 LAB — ANTI-DNA ANTIBODY, DOUBLE-STRANDED: ds DNA Ab: 2 IU/mL

## 2015-03-07 LAB — ANA: Anti Nuclear Antibody(ANA): NEGATIVE

## 2015-03-08 LAB — QUANTIFERON TB GOLD ASSAY (BLOOD)
INTERFERON GAMMA RELEASE ASSAY: NEGATIVE
Mitogen-Nil: >10 IU/mL
Quantiferon Nil Value: 0.06 IU/mL

## 2015-03-13 ENCOUNTER — Encounter: Payer: Self-pay | Admitting: Family Medicine

## 2015-03-13 ENCOUNTER — Other Ambulatory Visit (INDEPENDENT_AMBULATORY_CARE_PROVIDER_SITE_OTHER): Payer: 59

## 2015-03-13 ENCOUNTER — Other Ambulatory Visit: Payer: 59

## 2015-03-13 ENCOUNTER — Ambulatory Visit (INDEPENDENT_AMBULATORY_CARE_PROVIDER_SITE_OTHER): Payer: 59 | Admitting: Family Medicine

## 2015-03-13 VITALS — BP 112/68 | HR 80 | Ht 65.75 in | Wt 136.0 lb

## 2015-03-13 DIAGNOSIS — M1711 Unilateral primary osteoarthritis, right knee: Secondary | ICD-10-CM | POA: Diagnosis not present

## 2015-03-13 DIAGNOSIS — M2341 Loose body in knee, right knee: Secondary | ICD-10-CM

## 2015-03-13 DIAGNOSIS — M2340 Loose body in knee, unspecified knee: Secondary | ICD-10-CM | POA: Insufficient documentation

## 2015-03-13 NOTE — Assessment & Plan Note (Signed)
Patient did have another aspiration of the knee today but I do think that this likely come back again. We discussed which sending this for a smeared to rule out tuberculosis completely. If this is negative then we need to move forward with possible surgical intervention. Positive send to a specialist for infectious disease as well as a further evaluation with orthopedic surgery as well. Patient has failed all other conservative therapy at this time. We discussed icing regimen. We discussed home exercises. All the patient's questions were answered today.  Spent  25 minutes with patient face-to-face and had greater than 50% of counseling including as described above in assessment and plan.

## 2015-03-13 NOTE — Progress Notes (Signed)
Pre visit review using our clinic review tool, if applicable. No additional management support is needed unless otherwise documented below in the visit note. 

## 2015-03-13 NOTE — Patient Instructions (Signed)
Good to see you  We will get labs Depending on what we find we will decide who we will send you to.  Wear knee brace if it helps.  Thanks

## 2015-03-13 NOTE — Progress Notes (Signed)
[name] [place], [place] [address] Phone: [phone] Subjective:    I'm seeing this patient by the request  of:  Osborne Casco, MD   CC: Right knee pain follow-up   [id number] Kristy Haynes is a 65 y.o. female coming in with complaint of right knee pain.  Patient was found to have what appeared to be more of a ganglion cyst in the right knee. Patient had this recurrent even after aspiration. Because of this advance imaging was warranted and done. MRI was independently visualized by me. Patient's MRI of the right knee did show that patient had what appeared to be a rice body. Concern for possible autoimmune disease or tuberculosis. Patient was sent for labs that showed a negative quad to fair and cold test for tuberculosis as well as negative workup for autoimmune diseases. Patient is coming back with recurrent  Needed for aspiration. Continues to have the pain. No systemic findings that are consistent with any systemic illnesses at this time.     Past Medical History  Diagnosis Date  . Osteopenia   . Irregular heart beat     echo/stress test  (neg) 02/2008  . Anemia   . Positive TB test     x-ray normal  . Abnormal uterine bleeding     x2 after childbirth x1  . Transfusion history 1977  . Fibroid   . Breast discharge     Bilateral 25 years ago  . Anxiety   . Elevated lipids   . Arthritis     right knee/using Voltaren gel  . Osteopenia   . Melanoma in situ of face ([place])     nose   Past Surgical History  Procedure Laterality Date  . Tubal ligation    . Laparoscopy  1998  . Hernia repair Bilateral   . Back surgery  1986  . Dilation and curettage of uterus      x3  . Skin biopsy      on nose   Social History   Social History  . Marital Status: Divorced    Spouse Name: N/A  . Number of Children: N/A  . Years of Education: N/A   Social History Main Topics  . Smoking status: Current Every Day Smoker  -- 0.50 packs/day    Types: Cigarettes  . Smokeless tobacco: Never Used  . Alcohol Use: 0.0 oz/week    0 Standard drinks or equivalent per week     Comment: rarely  . Drug Use: No  . Sexual Activity: No     Comment: BTL   Other Topics Concern  . None   Social History Narrative   Allergies  Allergen Reactions  . Codeine   . Demerol [Meperidine]    Family History  Problem Relation Age of Onset  . Heart attack Maternal Grandmother     Past medical history, social, surgical and family history all reviewed in electronic medical record.  No pertanent information unless stated regarding to the chief complaint.   Review of Systems: No headache, visual changes, nausea, vomiting, diarrhea, constipation, dizziness, abdominal pain, skin rash, fevers, chills, night sweats, weight loss, swollen lymph nodes, body aches, joint swelling, muscle aches, chest pain, shortness of breath, mood changes.   Objective Blood pressure 112/68, pulse 80, height 5' 5.75" (1.67 m), weight 136 lb (61.689 kg), last menstrual period 01/06/1999, SpO2 93 %.  General: No apparent distress alert and oriented x3 mood and affect normal, dressed appropriately.  HEENT: Pupils equal, extraocular movements intact  Respiratory: Patient's speak in full sentences and does not appear short of breath  Cardiovascular: No lower extremity edema, non tender, no erythema  Skin: Warm dry intact with no signs of infection or rash on extremities or on axial skeleton.  Abdomen: Soft nontender  Neuro: Cranial nerves II through XII are intact, neurovascularly intact in all extremities with 2+ DTRs and 2+ pulses.  Lymph: No lymphadenopathy of posterior or anterior cervical chain or axillae bilaterally.  Gait normal with good balance and coordination.  MSK:  Non tender with full range of motion and good stability and symmetric strength and tone of shoulders, elbows, wrist, hip, and ankles bilaterally. Mild arthritic changes of multiple  joints Knee: Right Patient does have some atrophy   Her quadricep muscles and patient continues to have large swelling of the knee suprapatellar. Patient does have what appears to be crepitus-like sound when pushed on fluid.  Moderately tender to palpation Lacks last 15 of flexion  Which is worse Ligaments with solid consistent endpoints including ACL, PCL, LCL, MCL. Mild positive Mcmurray's, Apley's, and Thessalonian tests. Non painful patellar compression. Patellar glide with mild crepitus. Patellar and quadriceps tendons unremarkable. Hamstring and quadriceps strength is normal.  Contralateral leg unremarkable   Procedure: Real-time Ultrasound Guided Injection of right knee Device: GE Logiq E  Ultrasound guided injection is preferred based studies that show increased duration, increased effect, greater accuracy, decreased procedural pain, increased response rate, and decreased cost with ultrasound guided versus blind injection.  Verbal informed consent obtained.  Time-out conducted.  Noted no overlying erythema, induration, or other signs of local infection.  Skin prepped in a sterile fashion.  Local anesthesia: Topical Ethyl chloride.  With sterile technique and under real time ultrasound guidance: With a 22-gauge 2 inch needle patient was injected with 4 cc of 0.5% Marcaine and aspirated on total of 45 mL of jellylike fluid then does have strawlike colored   .  This was from a superior lateral approach.  Completed without difficulty  Pain immediately resolved suggesting accurate placement of the medication.  Advised to call if fevers/chills, erythema, induration, drainage, or persistent bleeding.  Images permanently stored and available for review in the ultrasound unit.  Impression: Technically successful ultrasound guided injection.     Impression and Recommendations:     This case required medical decision making of moderate complexity.      Note: This dictation was  prepared with Dragon dictation along with smaller phrase technology. Any transcriptional errors that result from this process are unintentional.

## 2015-03-25 ENCOUNTER — Telehealth: Payer: Self-pay | Admitting: *Deleted

## 2015-03-25 DIAGNOSIS — M2341 Loose body in knee, right knee: Secondary | ICD-10-CM

## 2015-03-25 NOTE — Telephone Encounter (Signed)
Pt left msg on vmail requesting results from fluid drawn off knee at last OV & what the next steps are?

## 2015-04-06 HISTORY — PX: KNEE SURGERY: SHX244

## 2015-04-30 ENCOUNTER — Ambulatory Visit (INDEPENDENT_AMBULATORY_CARE_PROVIDER_SITE_OTHER): Payer: 59 | Admitting: Family Medicine

## 2015-04-30 ENCOUNTER — Encounter: Payer: Self-pay | Admitting: Family Medicine

## 2015-04-30 ENCOUNTER — Other Ambulatory Visit (INDEPENDENT_AMBULATORY_CARE_PROVIDER_SITE_OTHER): Payer: 59

## 2015-04-30 VITALS — BP 106/62 | HR 78 | Ht 65.75 in | Wt 133.0 lb

## 2015-04-30 DIAGNOSIS — M1812 Unilateral primary osteoarthritis of first carpometacarpal joint, left hand: Secondary | ICD-10-CM

## 2015-04-30 DIAGNOSIS — M79642 Pain in left hand: Secondary | ICD-10-CM

## 2015-04-30 DIAGNOSIS — M2341 Loose body in knee, right knee: Secondary | ICD-10-CM

## 2015-04-30 NOTE — Progress Notes (Signed)
[name] [place], [place] [address] Phone: [phone] Subjective:    I'm seeing this patient by the request  of:  Osborne Casco, MD   [id number] hand and thumb pain   [id number] Kristy Haynes is a 65 y.o. female coming in with complaint of left hand and thumb pain. Patient has had this pain for quite some time. States that in hand pain approximately 15 years ago and was given an injection in both thumbs. Since then had been doing well. Now having worsening symptoms. Seems to be only in the left side. When she lifts something heavy or she moves something around way she has a severe sharp pain. Dull aching throbbing pain sometimes it seems to be constant. Worse with certain activities as well as weather. Rates the severity of pain a 6 out of 10. Patient is concerned because she will be having knee surgery in the near future and now is concerned that she will need to be lifting herself more with her hands and does not want to exacerbate this pain.     Past Medical History  Diagnosis Date  . Osteopenia   . Irregular heart beat     echo/stress test  (neg) 02/2008  . Anemia   . Positive TB test     x-ray normal  . Abnormal uterine bleeding     x2 after childbirth x1  . Transfusion history 1977  . Fibroid   . Breast discharge     Bilateral 25 years ago  . Anxiety   . Elevated lipids   . Arthritis     right knee/using Voltaren gel  . Osteopenia   . Melanoma in situ of face ([place])     nose   Past Surgical History  Procedure Laterality Date  . Tubal ligation    . Laparoscopy  1998  . Hernia repair Bilateral   . Back surgery  1986  . Dilation and curettage of uterus      x3  . Skin biopsy      on nose   Social History   Social History  . Marital Status: Divorced    Spouse Name: N/A  . Number of Children: N/A  . Years of Education: N/A   Social History Main Topics  . Smoking status: Current Every Day Smoker --  0.50 packs/day    Types: Cigarettes  . Smokeless tobacco: Never Used  . Alcohol Use: 0.0 oz/week    0 Standard drinks or equivalent per week     Comment: rarely  . Drug Use: No  . Sexual Activity: No     Comment: BTL   Other Topics Concern  . None   Social History Narrative   Allergies  Allergen Reactions  . Codeine   . Demerol [Meperidine]    Family History  Problem Relation Age of Onset  . Heart attack Maternal Grandmother     Past medical history, social, surgical and family history all reviewed in electronic medical record.  No pertanent information unless stated regarding to the chief complaint.   Review of Systems: No headache, visual changes, nausea, vomiting, diarrhea, constipation, dizziness, abdominal pain, skin rash, fevers, chills, night sweats, weight loss, swollen lymph nodes, body aches, joint swelling, muscle aches, chest pain, shortness of breath, mood changes.   Objective Blood pressure 106/62, pulse 78, height 5' 5.75" (1.67 m), weight 133 lb (60.328 kg), last menstrual period 01/06/1999, SpO2 97 %.  General: No apparent distress alert  and oriented x3 mood and affect normal, dressed appropriately.  HEENT: Pupils equal, extraocular movements intact  Respiratory: Patient's speak in full sentences and does not appear short of breath  Cardiovascular: No lower extremity edema, non tender, no erythema  Skin: Warm dry intact with no signs of infection or rash on extremities or on axial skeleton.  Abdomen: Soft nontender  Neuro: Cranial nerves II through XII are intact, neurovascularly intact in all extremities with 2+ DTRs and 2+ pulses.  Lymph: No lymphadenopathy of posterior or anterior cervical chain or axillae bilaterally.  Gait normal with good balance and coordination.  MSK:  Non tender with full range of motion and good stability and symmetric strength and tone of shoulders, elbows, wrist, hip, and ankles bilaterally. Mild arthritic changes of multiple  joints Hand exam shows the patient does have some arthritic changes of the proximal as well as distal IP joints. Patient is full range of motion of the fingers.positive grind test of patient's left CMC joint. Negative de Quervain's. Neurovascular intact with full strength. No atrophy of the thenar eminence bilaterally.   Limited muscular skeletal ultrasound was performed and interpreted by Hulan Saas, M  Limited ultrasound shows the patient does have severe CMC arthritis of the left hand. Patient has no signs of de Quervain's tenosynovitis. Impression: Severe CMC arthritis  Procedure: Real-time Ultrasound Guided Injection of left CMC joint Device: GE Logiq E  Ultrasound guided injection is preferred based studies that show increased duration, increased effect, greater accuracy, decreased procedural pain, increased response rate, and decreased cost with ultrasound guided versus blind injection.  Verbal informed consent obtained.  Time-out conducted.  Noted no overlying erythema, induration, or other signs of local infection.  Skin prepped in a sterile fashion.  Local anesthesia: Topical Ethyl chloride.  With sterile technique and under real time ultrasound guidance:  With a 25-gauge half-inch needle patient was injected with a total of 0.5 mL of 0.5% Marcaine and 0.5 mL of Kenalog 40 mg/dL. Completed without difficulty  Pain immediately resolved suggesting accurate placement of the medication.  Advised to call if fevers/chills, erythema, induration, drainage, or persistent bleeding.  Images permanently stored and available for review in the ultrasound unit.  Impression: Technically successful ultrasound guided injection.    Impression and Recommendations:     This case required medical decision making of moderate complexity.      Note: This dictation was prepared with Dragon dictation along with smaller phrase technology. Any transcriptional errors that result from this process  are unintentional.

## 2015-04-30 NOTE — Assessment & Plan Note (Signed)
Having surgery in 2 days' time

## 2015-04-30 NOTE — Patient Instructions (Signed)
Good to see you.  Ice 20 minutes 2 times daily. Usually after activity and before bed. We can repeat this injection in the thumb if needed There are braces if it gets worse See me again when you need me You will do great with the injections.

## 2015-04-30 NOTE — Assessment & Plan Note (Signed)
Patient given injection today. Tolerated the procedure well. Patient will continue with topical anti-inflammatories, icing protocol, which exercises to do an which was potentially avoid. We discussed with patient having to limit the amount of stress on the thumb while she has the surgery on her knee. Patient will come back and see me again on an as-needed basis with her upcoming surgery.if worsening symptoms patient could be a candidate for custom bracing.  Spent  25 minutes with patient face-to-face and had greater than 50% of counseling including as described above in assessment and plan.

## 2015-04-30 NOTE — Progress Notes (Signed)
Pre visit review using our clinic review tool, if applicable. No additional management support is needed unless otherwise documented below in the visit note. 

## 2015-05-02 ENCOUNTER — Other Ambulatory Visit: Payer: Self-pay | Admitting: Orthopaedic Surgery

## 2015-05-03 LAB — AFB CULTURE WITH SMEAR (NOT AT ARMC)

## 2015-07-23 ENCOUNTER — Encounter (INDEPENDENT_AMBULATORY_CARE_PROVIDER_SITE_OTHER): Payer: 59 | Admitting: Ophthalmology

## 2015-07-23 DIAGNOSIS — H43813 Vitreous degeneration, bilateral: Secondary | ICD-10-CM

## 2015-07-23 DIAGNOSIS — H53411 Scotoma involving central area, right eye: Secondary | ICD-10-CM

## 2015-07-23 DIAGNOSIS — H2513 Age-related nuclear cataract, bilateral: Secondary | ICD-10-CM | POA: Diagnosis not present

## 2015-10-24 ENCOUNTER — Other Ambulatory Visit: Payer: Self-pay | Admitting: Obstetrics & Gynecology

## 2015-10-25 NOTE — Telephone Encounter (Signed)
Medication refill request: ALPRAZolam 0.25mg  Last AEX:  01/18/15 SM Next AEX: 05/15/16 Last MMG (if hormonal medication request): 09/05/14 BIRADS2 benign Refill authorized: 01/08/15 #30 w/0 refills; today please advise

## 2015-10-28 NOTE — Telephone Encounter (Signed)
Prescription for Xanax 0.25 mg tablets faxed to [facility] on [address]. Fax #: [phone].

## 2015-12-18 ENCOUNTER — Encounter: Payer: Self-pay | Admitting: Obstetrics & Gynecology

## 2016-02-08 ENCOUNTER — Other Ambulatory Visit: Payer: Self-pay | Admitting: Obstetrics & Gynecology

## 2016-02-10 NOTE — Telephone Encounter (Signed)
Medication refill request: ALPRAZolam Last AEX:  01/18/15 SM Next AEX: 05/15/16 Last MMG (if hormonal medication request): 12/04/15 BIRADS 1 negative Refill authorized: 10/25/15 #30 w/0 refills; today please advise

## 2016-05-14 NOTE — Progress Notes (Signed)
66 y.o. G3P3 DivorcedCaucasianF here for annual exam.  Doing well, now.  Had melanoma-in-situ on her nose in fall 2016.  Has had several different procedures.    Patient's last menstrual period was 01/06/1999.          Sexually active: No.  The current method of family planning is post menopausal status.    Exercising: Yes.    walking Smoker:  yes  Health Maintenance: Pap:  11/13/13 Neg   09/10/11 Neg. HR HPV:neg  History of abnormal Pap:  no MMG:  12/04/15 BIRADS1:neg  Colonoscopy:  2014 f/u 10 years  BMD: 2017 mild osteopenia, Dr.Griffin TDaP:  2012  Pneumonia vaccine(s):  Done with PCP Zostavax:   Declines Hep C testing: States will do with Dr. Laurann Montana Screening Labs: PCP   reports that she has been smoking Cigarettes.  She has been smoking about 0.50 packs per day. She has never used smokeless tobacco. She reports that she drinks alcohol. She reports that she does not use drugs.  Past Medical History:  Diagnosis Date  . Abnormal uterine bleeding    x2 after childbirth x1  . Anemia   . Anxiety   . Arthritis    right knee/using Voltaren gel  . Breast discharge    Bilateral 25 years ago  . Elevated lipids   . Fibroid   . Irregular heart beat    echo/stress test  (neg) 02/2008  . Melanoma in situ of face ([place])    nose  . Osteopenia   . Osteopenia   . Positive TB test    x-ray normal  . Transfusion history 1977    Past Surgical History:  Procedure Laterality Date  . BACK SURGERY  1986  . DILATION AND CURETTAGE OF UTERUS     x3  . HERNIA REPAIR Bilateral   . KNEE SURGERY Right 04/2015  . LAPAROSCOPY  1998  . SKIN BIOPSY     on nose  . TUBAL LIGATION      Current Outpatient Prescriptions  Medication Sig Dispense Refill  . ALPRAZolam (XANAX) 0.25 MG tablet TAKE 1 TABLET BY MOUTH EVERY DAY AS NEEDED FOR ANXIETY 30 tablet 0  . atorvastatin (LIPITOR) 20 MG tablet Take 10 mg by mouth daily.     . Calcium Carbonate Antacid (TUMS PO) Take by mouth. Reported on  01/18/2015    . ciclopirox (PENLAC) 8 % solution     . Multiple Vitamin (MULTI-VITAMIN PO) Take by mouth daily.     No current facility-administered medications for this visit.     Family History  Problem Relation Age of Onset  . Heart attack Maternal Grandmother     ROS:  Pertinent items are noted in HPI.  Otherwise, a comprehensive ROS was negative.  Exam:   BP 98/66 (BP Location: Left Arm, Patient Position: Sitting, Cuff Size: Normal)   Pulse 78   Resp 16   Ht 5' 5.75" (1.67 m)   Wt 136 lb (61.7 kg)   LMP 01/06/1999   BMI 22.12 kg/m   Weight change: stable   Height: 5' 5.75" (167 cm)  Ht Readings from Last 3 Encounters:  05/15/16 5' 5.75" (1.67 m)  04/30/15 5' 5.75" (1.67 m)  03/13/15 5' 5.75" (1.67 m)    General appearance: alert, cooperative and appears stated age Head: Normocephalic, without obvious abnormality, atraumatic Neck: no adenopathy, supple, symmetrical, trachea midline and thyroid normal to inspection and palpation Lungs: clear to auscultation bilaterally Breasts: normal appearance, no masses or tenderness Heart: regular  rate and rhythm Abdomen: soft, non-tender; bowel sounds normal; no masses,  no organomegaly Extremities: extremities normal, atraumatic, no cyanosis or edema Skin: Skin color, texture, turgor normal. No rashes or lesions Lymph nodes: Cervical, supraclavicular, and axillary nodes normal. No abnormal inguinal nodes palpated Neurologic: Grossly normal   Pelvic: External genitalia:  no lesions              Urethra:  normal appearing urethra with no masses, tenderness or lesions              Bartholins and Skenes: normal                 Vagina: normal appearing vagina with normal color and discharge, no lesions              Cervix: no lesions              Pap taken: Yes.   Bimanual Exam:  Uterus:  normal size, contour, position, consistency, mobility, non-tender              Adnexa: normal adnexa and no mass, fullness, tenderness                Rectovaginal: Confirms               Anus:  normal sphincter tone, no lesions  Chaperone was present for exam.  A:  Well Woman with normal exam PMP, no HRT H/O anxiety Osteopenia Melanoma in situ treated at [facility].  Now having follow up every 6 months.  P:   Mammogram guidelines reviewed.  Pt doing yearly. pap smear obtained today Lab work done with Dr. Laurann Montana.  She will have a Hep c test done with her next lab work Does not need rx for Xanax 0.25mg .  Will call when/if she needs it. Release of BMD and Tetanus to see when next are due Return annually or prn

## 2016-05-15 ENCOUNTER — Encounter: Payer: Self-pay | Admitting: Obstetrics & Gynecology

## 2016-05-15 ENCOUNTER — Ambulatory Visit (INDEPENDENT_AMBULATORY_CARE_PROVIDER_SITE_OTHER): Payer: 59 | Admitting: Obstetrics & Gynecology

## 2016-05-15 ENCOUNTER — Other Ambulatory Visit (HOSPITAL_COMMUNITY)
Admission: RE | Admit: 2016-05-15 | Discharge: 2016-05-15 | Disposition: A | Discharge status: Home / Self Care | Payer: 59 | Source: Ambulatory Visit | Attending: Obstetrics & Gynecology | Admitting: Obstetrics & Gynecology

## 2016-05-15 VITALS — BP 98/66 | HR 78 | Resp 16 | Ht 65.75 in | Wt 136.0 lb

## 2016-05-15 DIAGNOSIS — Z124 Encounter for screening for malignant neoplasm of cervix: Secondary | ICD-10-CM | POA: Diagnosis not present

## 2016-05-15 DIAGNOSIS — Z01411 Encounter for gynecological examination (general) (routine) with abnormal findings: Secondary | ICD-10-CM | POA: Insufficient documentation

## 2016-05-15 DIAGNOSIS — Z01419 Encounter for gynecological examination (general) (routine) without abnormal findings: Secondary | ICD-10-CM

## 2016-05-15 NOTE — Patient Instructions (Signed)
Please have Hepatitis C testing done with your next blood work

## 2016-05-19 LAB — CYTOLOGY - PAP: DIAGNOSIS: NEGATIVE

## 2016-08-10 ENCOUNTER — Other Ambulatory Visit: Payer: Self-pay | Admitting: Obstetrics & Gynecology

## 2016-08-10 NOTE — Telephone Encounter (Signed)
eScribe request from [facility] for refill on ALPRAZOLAM Last filled - 02/10/16, #30 X 0 RF Last AEX - 05/15/16 Next AEX - 08/06/17 Last MMG - N/A  Please advise refills. Thank you.

## 2016-08-13 NOTE — Telephone Encounter (Signed)
Signed prescription faxed to [facility].

## 2016-11-18 ENCOUNTER — Other Ambulatory Visit: Payer: Self-pay | Admitting: Obstetrics & Gynecology

## 2016-11-18 NOTE — Telephone Encounter (Signed)
Medication refill request: Xanax Last AEX:  05-15-16  Next AEX: 08-06-17 Last MMG (if hormonal medication request): 12-04-15 WNL  Refill authorized: please advise

## 2016-11-19 NOTE — Telephone Encounter (Signed)
Rx faxed to pharmacy today 

## 2016-12-16 DIAGNOSIS — M79641 Pain in right hand: Secondary | ICD-10-CM | POA: Diagnosis not present

## 2016-12-16 DIAGNOSIS — M79642 Pain in left hand: Secondary | ICD-10-CM | POA: Diagnosis not present

## 2016-12-16 DIAGNOSIS — M1811 Unilateral primary osteoarthritis of first carpometacarpal joint, right hand: Secondary | ICD-10-CM | POA: Diagnosis not present

## 2016-12-16 DIAGNOSIS — M65321 Trigger finger, right index finger: Secondary | ICD-10-CM | POA: Diagnosis not present

## 2016-12-16 DIAGNOSIS — M1812 Unilateral primary osteoarthritis of first carpometacarpal joint, left hand: Secondary | ICD-10-CM | POA: Diagnosis not present

## 2017-01-13 DIAGNOSIS — M79641 Pain in right hand: Secondary | ICD-10-CM | POA: Diagnosis not present

## 2017-01-13 DIAGNOSIS — M1812 Unilateral primary osteoarthritis of first carpometacarpal joint, left hand: Secondary | ICD-10-CM | POA: Diagnosis not present

## 2017-01-13 DIAGNOSIS — M65321 Trigger finger, right index finger: Secondary | ICD-10-CM | POA: Diagnosis not present

## 2017-01-13 DIAGNOSIS — M79642 Pain in left hand: Secondary | ICD-10-CM | POA: Diagnosis not present

## 2017-01-13 DIAGNOSIS — M1811 Unilateral primary osteoarthritis of first carpometacarpal joint, right hand: Secondary | ICD-10-CM | POA: Diagnosis not present

## 2017-01-27 DIAGNOSIS — D1800 Hemangioma unspecified site: Secondary | ICD-10-CM | POA: Diagnosis not present

## 2017-01-27 DIAGNOSIS — D225 Melanocytic nevi of trunk: Secondary | ICD-10-CM | POA: Diagnosis not present

## 2017-01-27 DIAGNOSIS — Z85820 Personal history of malignant melanoma of skin: Secondary | ICD-10-CM | POA: Diagnosis not present

## 2017-01-27 DIAGNOSIS — Z23 Encounter for immunization: Secondary | ICD-10-CM | POA: Diagnosis not present

## 2017-01-27 DIAGNOSIS — I781 Nevus, non-neoplastic: Secondary | ICD-10-CM | POA: Diagnosis not present

## 2017-01-27 DIAGNOSIS — L570 Actinic keratosis: Secondary | ICD-10-CM | POA: Diagnosis not present

## 2017-01-27 DIAGNOSIS — L814 Other melanin hyperpigmentation: Secondary | ICD-10-CM | POA: Diagnosis not present

## 2017-01-27 DIAGNOSIS — L821 Other seborrheic keratosis: Secondary | ICD-10-CM | POA: Diagnosis not present

## 2017-02-01 DIAGNOSIS — R69 Illness, unspecified: Secondary | ICD-10-CM | POA: Diagnosis not present

## 2017-03-22 DIAGNOSIS — Z0000 Encounter for general adult medical examination without abnormal findings: Secondary | ICD-10-CM | POA: Diagnosis not present

## 2017-03-22 DIAGNOSIS — Z720 Tobacco use: Secondary | ICD-10-CM | POA: Diagnosis not present

## 2017-03-22 DIAGNOSIS — Z1159 Encounter for screening for other viral diseases: Secondary | ICD-10-CM | POA: Diagnosis not present

## 2017-03-22 DIAGNOSIS — E7800 Pure hypercholesterolemia, unspecified: Secondary | ICD-10-CM | POA: Diagnosis not present

## 2017-03-22 DIAGNOSIS — M810 Age-related osteoporosis without current pathological fracture: Secondary | ICD-10-CM | POA: Diagnosis not present

## 2017-03-22 DIAGNOSIS — M1990 Unspecified osteoarthritis, unspecified site: Secondary | ICD-10-CM | POA: Diagnosis not present

## 2017-03-22 DIAGNOSIS — Z23 Encounter for immunization: Secondary | ICD-10-CM | POA: Diagnosis not present

## 2017-04-13 ENCOUNTER — Other Ambulatory Visit: Payer: Self-pay | Admitting: Obstetrics & Gynecology

## 2017-04-13 NOTE — Telephone Encounter (Signed)
Medication refill request: Xanax Last AEX:  05-15-16  Next AEX: 08-06-17  Last MMG (if hormonal medication request): 12-04-15 WNL  Refill authorized: please advise

## 2017-05-03 DIAGNOSIS — R69 Illness, unspecified: Secondary | ICD-10-CM | POA: Diagnosis not present

## 2017-07-12 ENCOUNTER — Telehealth: Payer: Self-pay | Admitting: Obstetrics & Gynecology

## 2017-07-12 DIAGNOSIS — M65321 Trigger finger, right index finger: Secondary | ICD-10-CM | POA: Diagnosis not present

## 2017-07-12 DIAGNOSIS — G5603 Carpal tunnel syndrome, bilateral upper limbs: Secondary | ICD-10-CM | POA: Diagnosis not present

## 2017-07-12 DIAGNOSIS — M79641 Pain in right hand: Secondary | ICD-10-CM | POA: Diagnosis not present

## 2017-07-12 NOTE — Telephone Encounter (Signed)
Spoke with patient. Patient reports lump on right labia for the last 4 months. Lump comes and goes. Size of walnut. Denies pain, bleeding or d/c.   Patient is scheduled for AEX 08/06/17. Patient requesting OV prior to AEX. OV scheduled for 7/11 at 10:15am with Dr. Sabra Heck.   Routing to provider for final review. Patient is agreeable to disposition. Will close encounter.

## 2017-07-12 NOTE — Telephone Encounter (Signed)
Patient has a lump on the outside of her vagina.

## 2017-07-15 ENCOUNTER — Other Ambulatory Visit: Payer: Self-pay

## 2017-07-15 ENCOUNTER — Ambulatory Visit: Payer: Medicare HMO | Admitting: Obstetrics & Gynecology

## 2017-07-15 ENCOUNTER — Encounter: Payer: Self-pay | Admitting: Obstetrics & Gynecology

## 2017-07-15 VITALS — BP 100/60 | HR 84 | Resp 16 | Ht 65.75 in | Wt 137.0 lb

## 2017-07-15 DIAGNOSIS — N898 Other specified noninflammatory disorders of vagina: Secondary | ICD-10-CM

## 2017-07-15 NOTE — Progress Notes (Signed)
GYNECOLOGY  VISIT  CC:   Vulvar lump  HPI: 67 y.o. G3P3 Divorced Caucasian female here for labial mass that she's felt more on the right side and intermittently for the past four months.  Feels like a bump or bulge.  Felt it this morning.  Is not painful.  Has not drainage or blood.  Denies vaginal bleeding.  Denies new issues with bladder or bowel function.  Does not think it is prolapse.  Denies vaginal discharge.  Denies pelvic pain as well.  Nothing specific seems to make this lesion occur and she cannot account for why she cannot always feel it.    GYNECOLOGIC HISTORY: Patient's last menstrual period was 01/06/1999. Contraception: post menopausal  Menopausal hormone therapy: none  Patient Active Problem List   Diagnosis Date Noted  . CMC arthritis, thumb, degenerative 04/30/2015  . Body, rice, knee 03/13/2015  . Knee mass 02/20/2015  . Chronic instability of knee involving posterior horn of lateral meniscus 01/30/2015  . Ganglion cyst 01/30/2015  . Degenerative arthritis of right knee 01/30/2015  . Melanoma in situ of face ([place]) 08/29/2014    Past Medical History:  Diagnosis Date  . Abnormal uterine bleeding    x2 after childbirth x1  . Anemia   . Anxiety   . Arthritis    right knee/using Voltaren gel  . Breast discharge    Bilateral 25 years ago  . Elevated lipids   . Fibroid   . Irregular heart beat    echo/stress test  (neg) 02/2008  . Melanoma in situ of face ([place])    nose  . Osteopenia   . Osteopenia   . Positive TB test    x-ray normal  . Transfusion history 1977    Past Surgical History:  Procedure Laterality Date  . BACK SURGERY  1986  . DILATION AND CURETTAGE OF UTERUS     x3  . HERNIA REPAIR Bilateral   . KNEE SURGERY Right 04/2015  . LAPAROSCOPY  1998  . SKIN BIOPSY     on nose  . TUBAL LIGATION      MEDS:   Current Outpatient Medications on File Prior to Visit  Medication Sig Dispense Refill  . ALPRAZolam (XANAX) 0.25 MG tablet TAKE 1  TABLET EVERY DAY AS NEEDED FOR ANXIETY 30 tablet 0  . Ascorbic Acid (VITAMIN C) 100 MG tablet daily.    Marland Kitchen atorvastatin (LIPITOR) 20 MG tablet Take 10 mg by mouth daily.     . Calcium Carbonate Antacid (TUMS PO) Take by mouth. Reported on 01/18/2015    . Cholecalciferol (VITAMIN D3) 1000 units CAPS daily.    . Ferrous Sulfate (IRON) 28 MG TABS daily.    . Multiple Vitamin (MULTI-VITAMIN PO) Take by mouth daily.    . vitamin E 400 UNIT capsule Take 400 Units by mouth daily.     No current facility-administered medications on file prior to visit.     ALLERGIES: Codeine and Demerol [meperidine]  Family History  Problem Relation Age of Onset  . Heart attack Maternal Grandmother     SH:  Divorced, daily smoker  Review of Systems  HENT: Positive for congestion.   Genitourinary:       Vulvar lump  Musculoskeletal: Positive for myalgias.  All other systems reviewed and are negative.   PHYSICAL EXAMINATION:    BP 100/60 (BP Location: Right Arm, Patient Position: Sitting, Cuff Size: Normal)   Pulse 84   Resp 16   Ht 5' 5.75" (1.67 m)  Wt 137 lb (62.1 kg)   LMP 01/06/1999   BMI 22.28 kg/m     General appearance: alert, cooperative and appears stated age Lymph:  No inguinal LAD noted  Pelvic: External genitalia:  no lesions              Urethra:  normal appearing urethra with no masses, tenderness or lesions              Bartholins and Skenes: normal                 Vagina: normal appearing vagina with normal color and discharge, no lesions              Cervix: no lesions              Bimanual Exam:  Uterus:  normal size, contour, position, consistency, mobility, non-tender              Adnexa: no mass, fullness, tenderness              Anus:   no lesions  Assessment: Vaginal/vulvar bulge with normal exam today.  Attempted multiple ways to see possible lesion  Plan: D/w pt normal exam today and possible intermittent bartholin's gland cyst.  She has no evidence of any  significant prolapse today.  Advised pt to return for repeat exam when she's feeling this bulge again.

## 2017-07-21 ENCOUNTER — Encounter: Payer: Self-pay | Admitting: Obstetrics & Gynecology

## 2017-08-06 ENCOUNTER — Encounter: Payer: Self-pay | Admitting: Obstetrics & Gynecology

## 2017-08-06 ENCOUNTER — Encounter

## 2017-08-06 ENCOUNTER — Other Ambulatory Visit: Payer: Self-pay

## 2017-08-06 ENCOUNTER — Ambulatory Visit (INDEPENDENT_AMBULATORY_CARE_PROVIDER_SITE_OTHER): Payer: Medicare HMO | Admitting: Obstetrics & Gynecology

## 2017-08-06 VITALS — BP 108/58 | HR 72 | Resp 14 | Ht 65.75 in | Wt 137.2 lb

## 2017-08-06 DIAGNOSIS — Z01419 Encounter for gynecological examination (general) (routine) without abnormal findings: Secondary | ICD-10-CM | POA: Diagnosis not present

## 2017-08-06 MED ORDER — ALPRAZOLAM 0.25 MG PO TABS: 0.2500 mg | ORAL_TABLET | ORAL | 0 refills | Status: DC | PRN

## 2017-08-06 NOTE — Progress Notes (Signed)
67 y.o. G3P3 DivorcedCaucasianF here for annual exam.  Doing well.  Denies vaginal bleeding.    Has appt with Dr. Laurann Montana in December.  Having blood work before that appt.  Will have second pneumonia vaccination then.  Patient's last menstrual period was 01/06/1999.          Sexually active: No.  The current method of family planning is post menopausal status.    Exercising: Yes.    walking Smoker:  yes  Health Maintenance: Pap:  05/15/16 Neg   11/13/13 neg  History of abnormal Pap:  no MMG:  12/02/16 BIRADS1:Neg  Colonoscopy:  2014 f/u 10 years  BMD:   2017 Mild Osteopenia  TDaP:  2018 Pneumonia vaccine(s):  Done prevnar Shingrix:  Not interested at this time.  Hep C testing: Donates blood and had testing with Dr. Laurann Montana Screening Labs: PCP   reports that she has been smoking cigarettes.  She has been smoking about 0.50 packs per day. She has never used smokeless tobacco. She reports that she drinks alcohol. She reports that she does not use drugs.  Past Medical History:  Diagnosis Date  . Abnormal uterine bleeding    x2 after childbirth x1  . Anemia   . Anxiety   . Arthritis    right knee/using Voltaren gel  . Breast discharge    Bilateral 25 years ago  . Carpal tunnel syndrome, bilateral   . Elevated lipids   . Fibroid   . Irregular heart beat    echo/stress test  (neg) 02/2008  . Melanoma in situ of face ([place])    nose  . Osteopenia   . Osteopenia   . Positive TB test    x-ray normal  . Transfusion history 1977    Past Surgical History:  Procedure Laterality Date  . BACK SURGERY  1986  . DILATION AND CURETTAGE OF UTERUS     x3  . HERNIA REPAIR Bilateral   . KNEE SURGERY Right 04/2015  . LAPAROSCOPY  1998  . SKIN BIOPSY     on nose  . TUBAL LIGATION      Current Outpatient Medications  Medication Sig Dispense Refill  . ALPRAZolam (XANAX) 0.25 MG tablet TAKE 1 TABLET EVERY DAY AS NEEDED FOR ANXIETY 30 tablet 0  . Ascorbic Acid (VITAMIN C) 100 MG tablet  daily.    Marland Kitchen atorvastatin (LIPITOR) 20 MG tablet Take 10 mg by mouth daily.     Marland Kitchen CALCIUM-MAGNESIUM-VITAMIN D PO Take by mouth daily.    . Ferrous Sulfate (IRON) 28 MG TABS daily.    . Multiple Vitamin (MULTI-VITAMIN PO) Take by mouth daily.    . vitamin E 400 UNIT capsule Take 400 Units by mouth daily.     No current facility-administered medications for this visit.     Family History  Problem Relation Age of Onset  . Heart attack Maternal Grandmother     Review of Systems  All other systems reviewed and are negative.   Exam:   BP (!) 108/58 (BP Location: Right Arm, Patient Position: Sitting, Cuff Size: Normal)   Pulse 72   Resp 14   Ht 5' 5.75" (1.67 m)   Wt 137 lb 3.2 oz (62.2 kg)   LMP 01/06/1999   BMI 22.31 kg/m     Height: 5' 5.75" (167 cm)  Ht Readings from Last 3 Encounters:  08/06/17 5' 5.75" (1.67 m)  07/15/17 5' 5.75" (1.67 m)  05/15/16 5' 5.75" (1.67 m)    General appearance:  alert, cooperative and appears stated age Head: Normocephalic, without obvious abnormality, atraumatic Neck: no adenopathy, supple, symmetrical, trachea midline and thyroid normal to inspection and palpation Lungs: clear to auscultation bilaterally Breasts: normal appearance, no masses or tenderness Heart: regular rate and rhythm Abdomen: soft, non-tender; bowel sounds normal; no masses,  no organomegaly Extremities: extremities normal, atraumatic, no cyanosis or edema Skin: Skin color, texture, turgor normal. No rashes or lesions Lymph nodes: Cervical, supraclavicular, and axillary nodes normal. No abnormal inguinal nodes palpated Neurologic: Grossly normal   Pelvic: External genitalia:  no lesions              Urethra:  normal appearing urethra with no masses, tenderness or lesions              Bartholins and Skenes: normal                 Vagina: normal appearing vagina with normal color and discharge, no lesions, midline rectocele, grade 2 noted today              Cervix: no  lesions              Pap taken: No. Bimanual Exam:  Uterus:  normal size, contour, position, consistency, mobility, non-tender              Adnexa: normal adnexa and no mass, fullness, tenderness               Rectovaginal: Confirms               Anus:  normal sphincter tone, no lesions  Chaperone was present for exam.  A:  Well Woman with normal exam PMP, no HRT H/o anxiety Mild osteopenia, last BMD done with Dr. Laurann Montana H/O melanoma in situ treated at [facility].  Being followed every 6 months.  Rectocele   P:   Mammogram guidelines reviewed.  Doing yearly. pap smear 2018.  Not indicated today. Lab work planned with Dr. Laurann Montana for later this year Information about pelvic relaxation given.  PT discussed a well as pessary use.  Is going to try and do PT on her own first.   Declines received the shingrix vaccination RF for Xanax 0.25mg  prn.  #30/0RF.  Usually doesn't get a refill each year.  Return annually or prn

## 2017-08-16 DIAGNOSIS — L814 Other melanin hyperpigmentation: Secondary | ICD-10-CM | POA: Diagnosis not present

## 2017-08-16 DIAGNOSIS — D225 Melanocytic nevi of trunk: Secondary | ICD-10-CM | POA: Diagnosis not present

## 2017-08-16 DIAGNOSIS — L821 Other seborrheic keratosis: Secondary | ICD-10-CM | POA: Diagnosis not present

## 2017-08-16 DIAGNOSIS — L570 Actinic keratosis: Secondary | ICD-10-CM | POA: Diagnosis not present

## 2017-08-16 DIAGNOSIS — D1800 Hemangioma unspecified site: Secondary | ICD-10-CM | POA: Diagnosis not present

## 2017-08-16 DIAGNOSIS — Z85820 Personal history of malignant melanoma of skin: Secondary | ICD-10-CM | POA: Diagnosis not present

## 2017-08-19 ENCOUNTER — Encounter: Payer: Self-pay | Admitting: Obstetrics & Gynecology

## 2017-08-30 DIAGNOSIS — R69 Illness, unspecified: Secondary | ICD-10-CM | POA: Diagnosis not present

## 2017-09-20 DIAGNOSIS — G5602 Carpal tunnel syndrome, left upper limb: Secondary | ICD-10-CM | POA: Insufficient documentation

## 2017-09-20 DIAGNOSIS — M79641 Pain in right hand: Secondary | ICD-10-CM | POA: Diagnosis not present

## 2017-09-20 DIAGNOSIS — E7800 Pure hypercholesterolemia, unspecified: Secondary | ICD-10-CM | POA: Diagnosis not present

## 2017-09-20 DIAGNOSIS — G5603 Carpal tunnel syndrome, bilateral upper limbs: Secondary | ICD-10-CM | POA: Diagnosis not present

## 2017-11-12 DIAGNOSIS — Z23 Encounter for immunization: Secondary | ICD-10-CM | POA: Diagnosis not present

## 2017-11-25 IMAGING — DX DG KNEE COMPLETE 4+V*R*
4 series · 4 of 4 positions shown · non-contrast
Comparison: None.

CLINICAL DATA: Chronic right knee pain for 4 years. No known
injury.

EXAM:
RIGHT KNEE - COMPLETE 4+ VIEW

[knee ap]
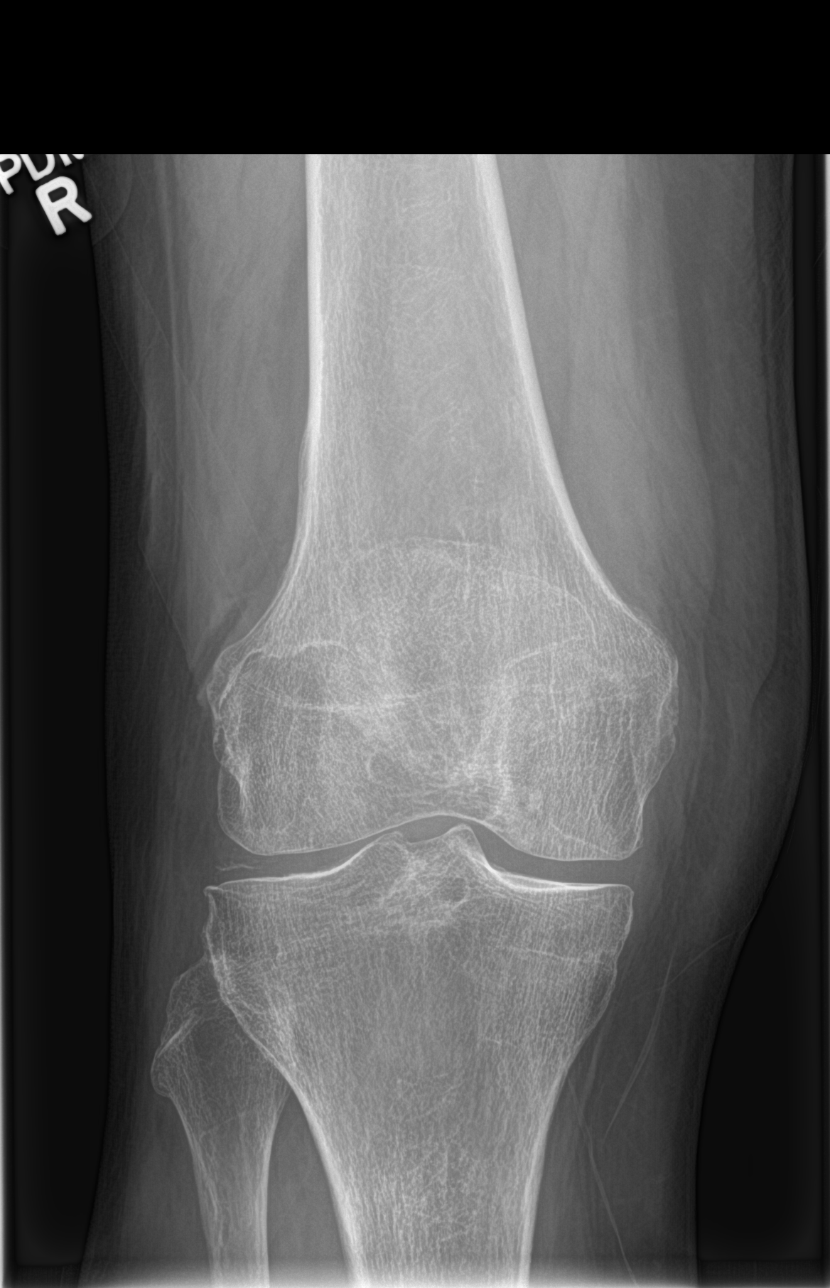

[knee tunnel]
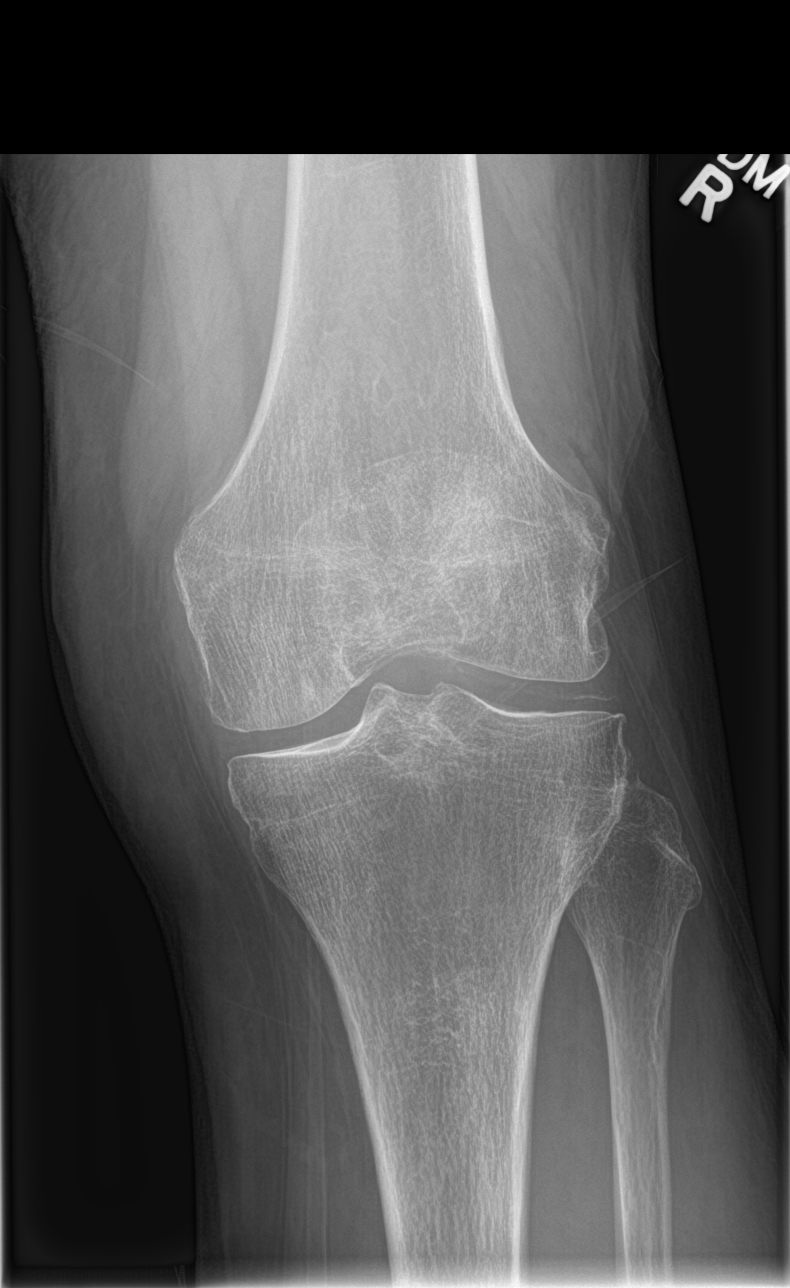

[knee lat]
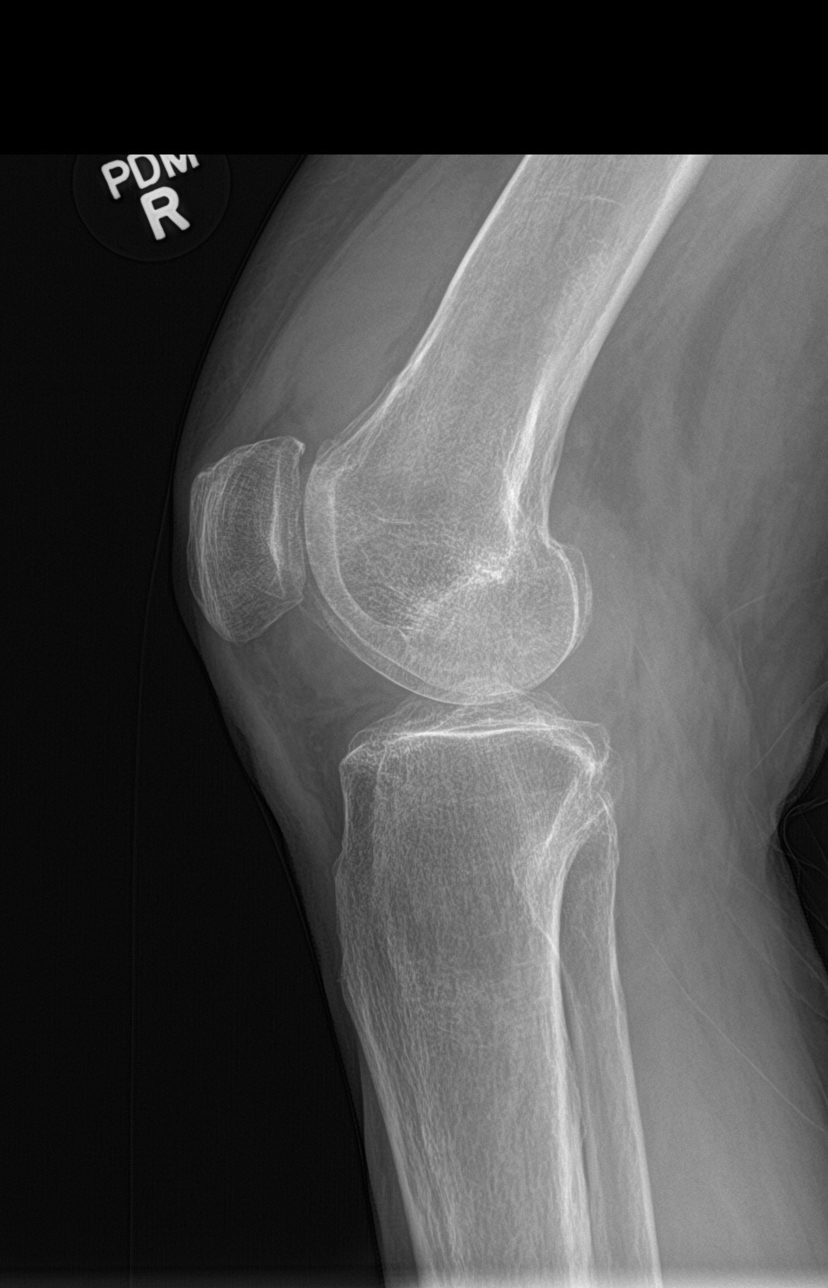

[sunrise]
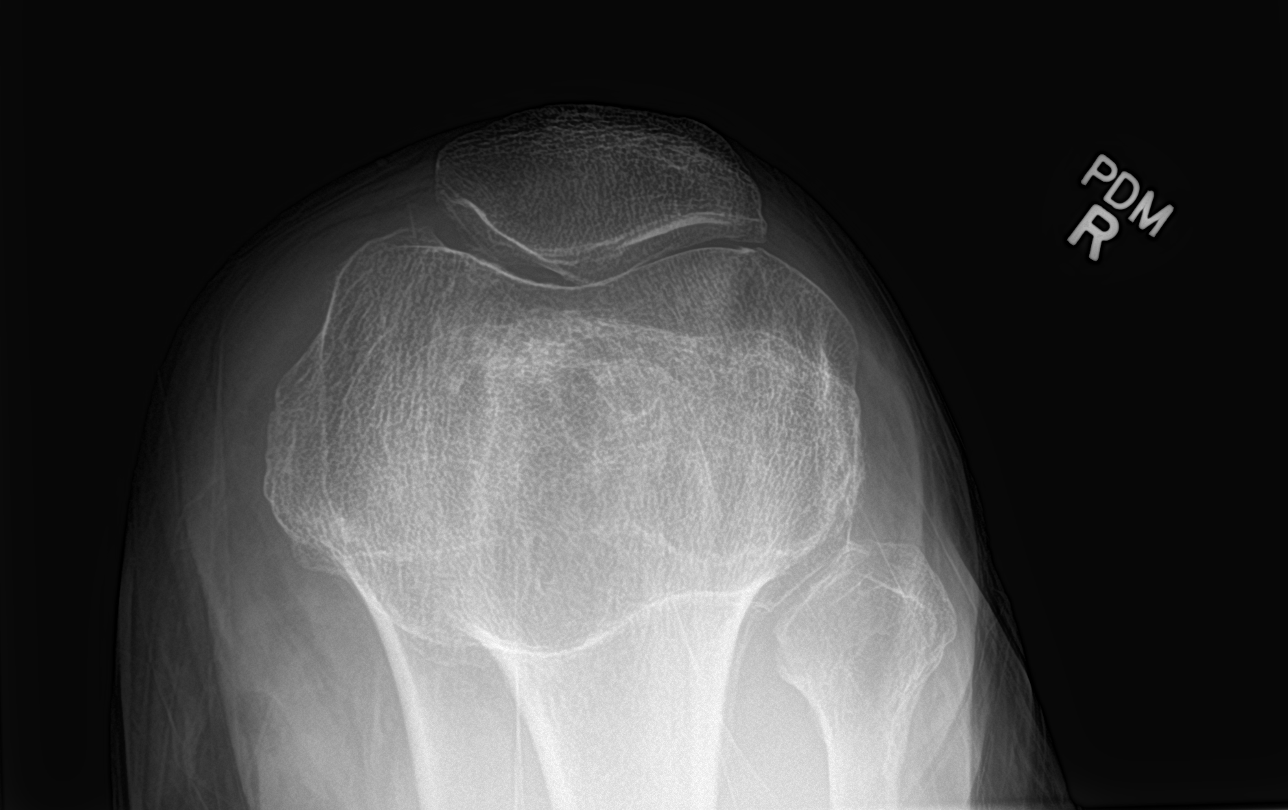

[4 of 4 positions shown; findings below may reference images not displayed]

FINDINGS: There is no evidence of acute fracture or dislocation. A moderate
knee joint effusion is present. There is at most minimal medial
compartment joint space narrowing. Mild lateral compartment
chondrocalcinosis is noted. The bones are osteopenic. No destructive
osseous lesion or radiopaque foreign body.
IMPRESSION: 1. Moderate knee joint effusion.
2. Mild lateral compartment chondrocalcinosis.

## 2017-11-29 DIAGNOSIS — G5603 Carpal tunnel syndrome, bilateral upper limbs: Secondary | ICD-10-CM | POA: Diagnosis not present

## 2017-11-29 DIAGNOSIS — G5601 Carpal tunnel syndrome, right upper limb: Secondary | ICD-10-CM | POA: Diagnosis not present

## 2017-12-06 DIAGNOSIS — Z1231 Encounter for screening mammogram for malignant neoplasm of breast: Secondary | ICD-10-CM | POA: Diagnosis not present

## 2017-12-22 ENCOUNTER — Encounter: Payer: Self-pay | Admitting: Obstetrics & Gynecology

## 2017-12-26 IMAGING — MR MR KNEE*R* WO/W CM
7 of 9 series · 28 of 40 positions shown · IV contrast (Multihance 12ml)
Comparison: 01/30/2015 and 02/20/2015

CLINICAL DATA: Right knee pain and swelling for several months.
History of melanoma.

EXAM:
MRI OF THE RIGHT KNEE WITHOUT AND WITH CONTRAST
TECHNIQUE: Multiplanar, multisequence MR imaging of the knee was performed
before and after the administration of intravenous contrast.
CONTRAST:  12mL MULTIHANCE GADOBENATE DIMEGLUMINE 529 MG/ML IV SOLN
Creatinine was obtained on site at [HOSPITAL] at [HOSPITAL].
Results: Creatinine 0.7 mg/dL.

[Series 6: PD fat-sat · axial · right · 3.0mm · 0.39mm/px · z∈[-91,+23]mm · 5 of 36 slices shown (1 of 3)]
[im 1/36]
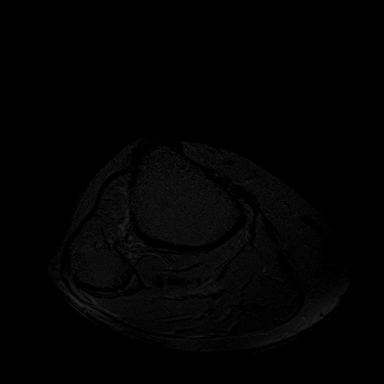
[im 9/36]
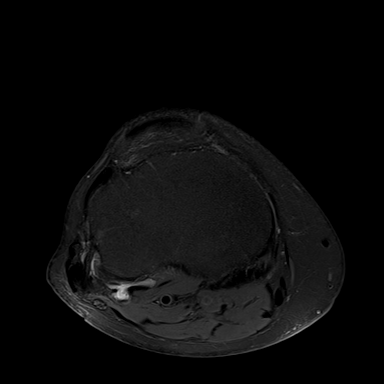
[im 18/36]
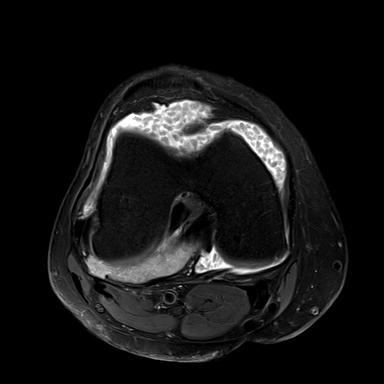
[im 27/36]
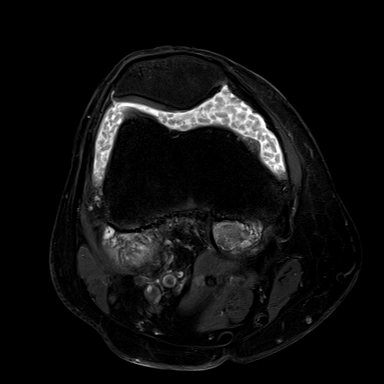
[im 36/36]
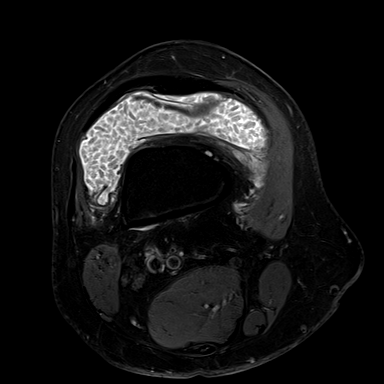

[Series 7: T1 · coronal · right · 3.0mm · 0.38mm/px · 5 of 33 slices shown]
[im 1/33]
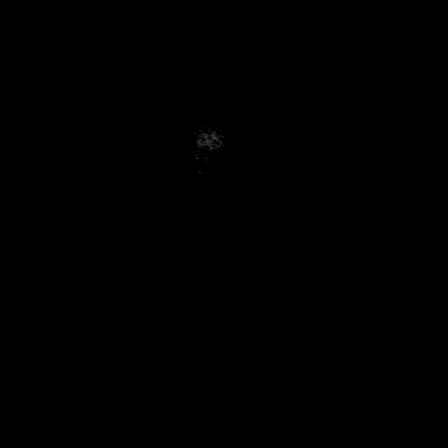
[im 9/33]
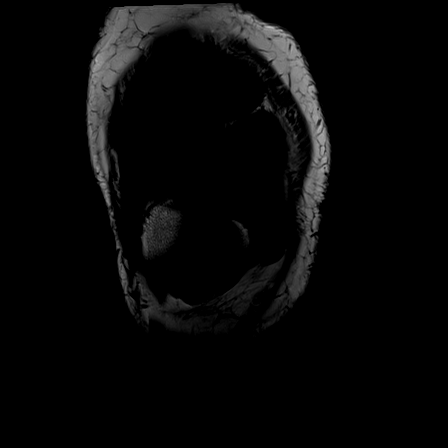
[im 17/33]
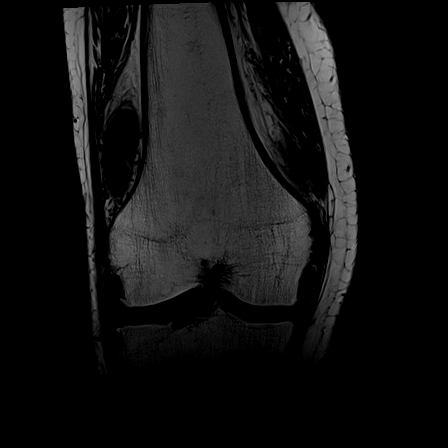
[im 25/33]
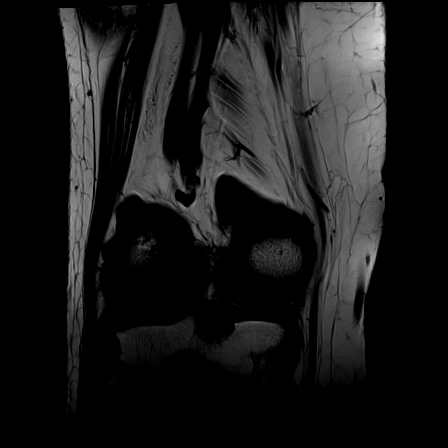
[im 33/33]
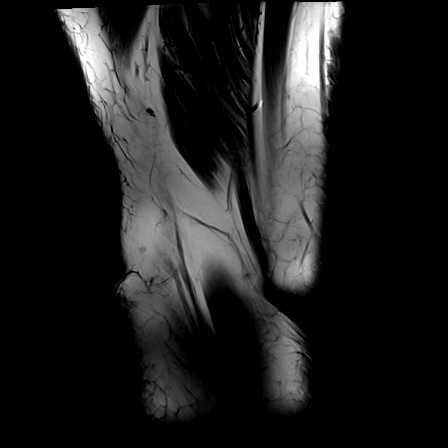

[Series 8: T2 fat-sat · coronal · right · 3.0mm · 0.38mm/px · 4 of 33 slices shown]
[im 1/33]
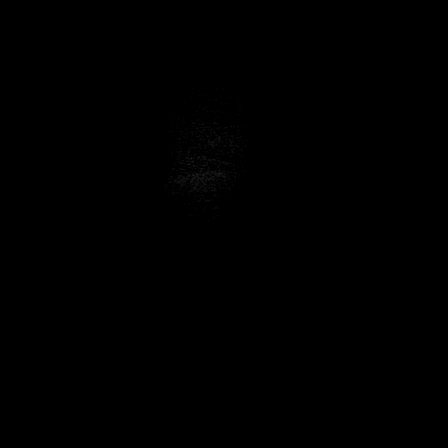
[im 11/33]
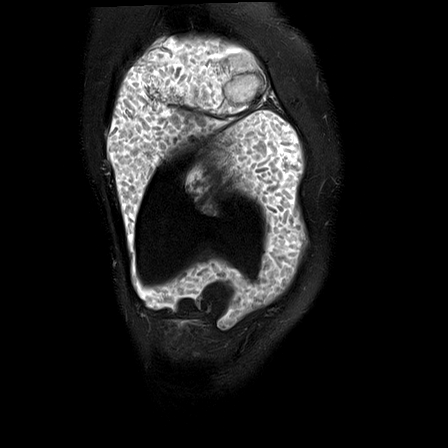
[im 22/33]
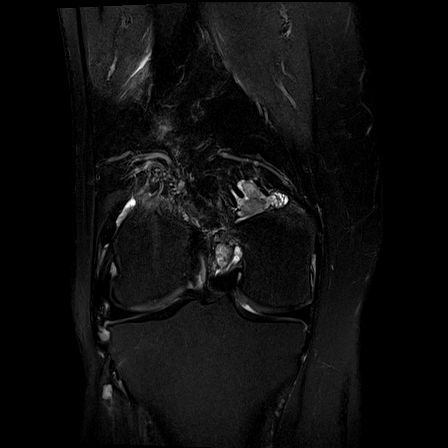
[im 33/33]
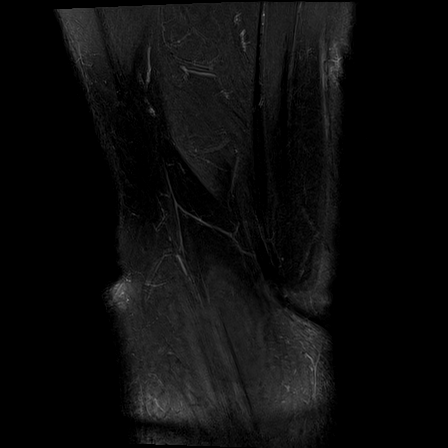

[Series 9: PD fat-sat · coronal · right · 3.0mm · 0.38mm/px · 4 of 33 slices shown (2 of 3)]
[im 1/33]
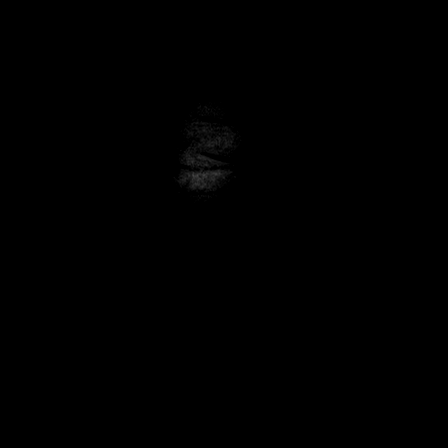
[im 11/33]
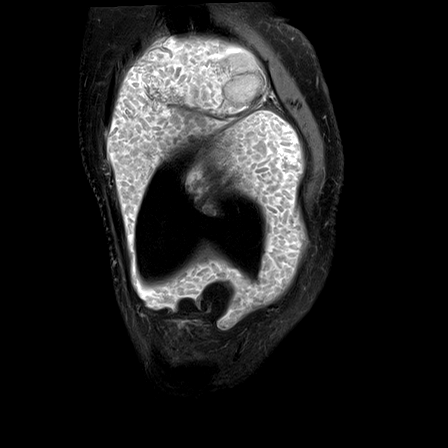
[im 22/33]
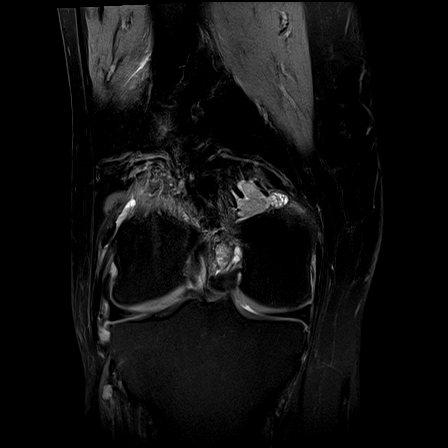
[im 33/33]
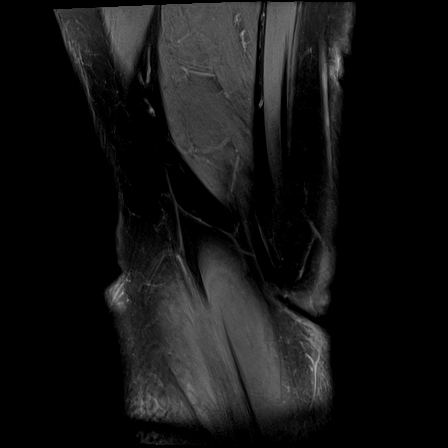

[Series 10: T1 fat-sat · axial · right · 3.0mm · 0.38mm/px · z∈[-84,+57]mm · 5 of 44 slices shown (1 of 2)]
[im 1/44]
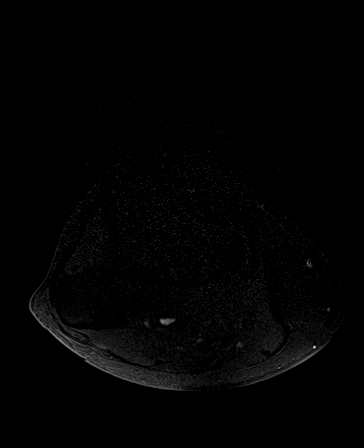
[im 11/44]
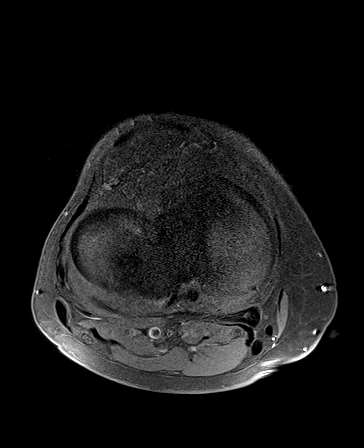
[im 22/44]
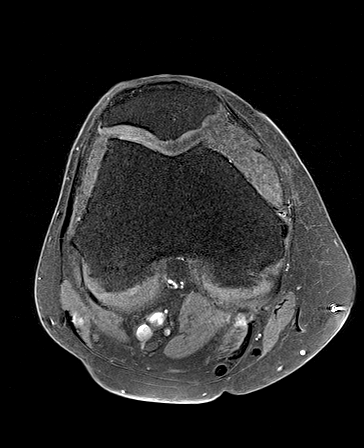
[im 33/44]
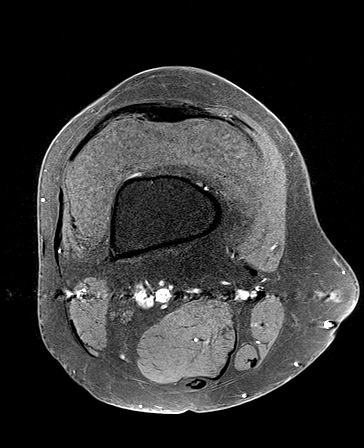
[im 44/44]
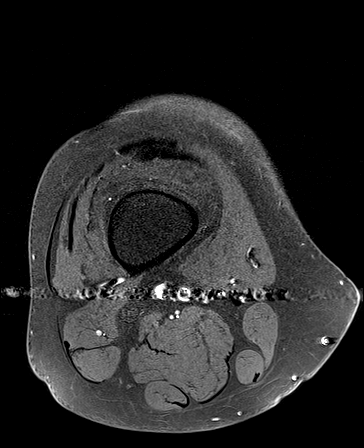

[Series 11: PD fat-sat · sagittal · right · 3.0mm · 0.38mm/px · 4 of 33 slices shown (3 of 3)]
[im 1/33]
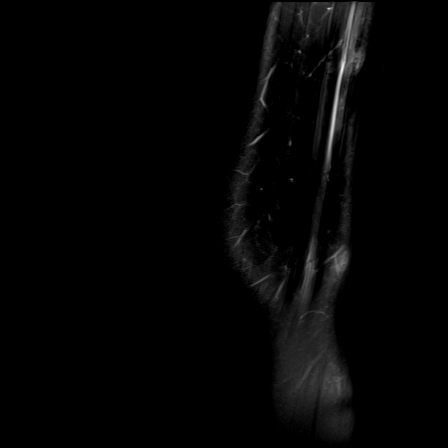
[im 11/33]
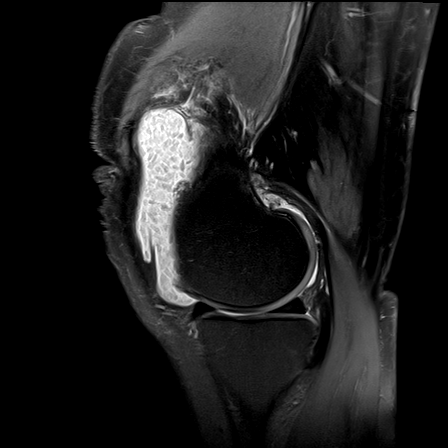
[im 22/33]
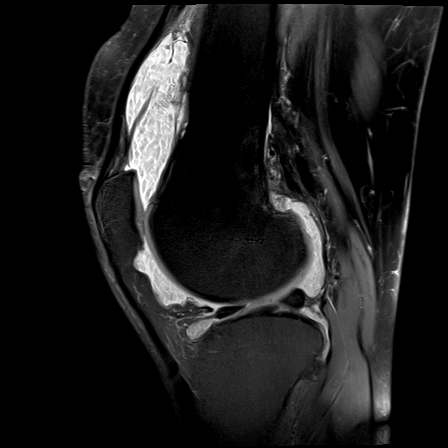
[im 33/33]
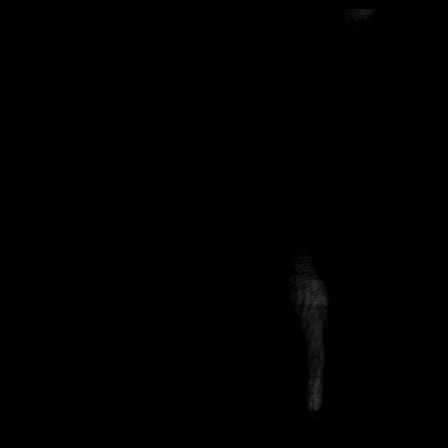

[Series 12: T1 fat-sat · axial · right · 3.0mm · 0.38mm/px · 1 of 44 slices shown (2 of 2)]
[im 1/44]
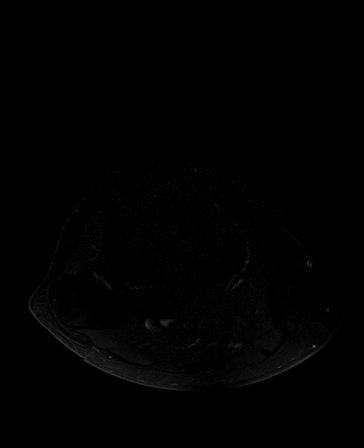

[28 of 40 positions shown; findings below may reference images not displayed]

FINDINGS: MENISCI

Medial meniscus:  Unremarkable

Lateral meniscus:  Unremarkable

LIGAMENTS

Cruciates:  Unremarkable

Collaterals:  Unremarkable

CARTILAGE

Patellofemoral: Generally moderate chondral thinning but with
full-thickness loss of articular cartilage along the posterior
patellar ridge and chondral irregularity and some more prominent
chondral thinning along portions of the medial patellar facet. Small
osteochondral lesion along the medial femoral trochlear groove on
image 18 series 6.

Medial: Mild to moderate chondral thinning with mild marginal
spurring.

Lateral: Mild degenerative chondral thinning with mild chondral
heterogeneity.

Joint: Large joint effusion with extensive Blasco body type filling
defects. More striking focal synovitis lung portions of the
suprapatellar bursa. Mild generalized marginal synovial thickening
and synovial enhancement characteristic of synovitis.

Popliteal Fossa:  Unremarkable

Extensor Mechanism:  Unremarkable

Bones: No significant extra-articular osseous abnormalities
identified.
IMPRESSION: 1. Large knee joint effusion with innumerable filling defects
favoring Blasco bodies, classically associated with rheumatoid
arthritis but also encountered in atypical tuberculous infection of
the joint or sarcoid. There is no associated calcification of the
structures on conventional radiography to suggest that these instead
represent primary synovial chondromatosis. There other foci of
synovitis in the knee along with generalized mild synovitis along
the synovial margin.
2. Variable degree of chondral thinning, most striking in the
patellofemoral joint where there is some full-thickness loss of
articular cartilage.
3. I do not find knee appearance characteristic for metastatic
melanoma to the knee.

## 2018-01-01 DIAGNOSIS — R8290 Unspecified abnormal findings in urine: Secondary | ICD-10-CM | POA: Diagnosis not present

## 2018-01-01 DIAGNOSIS — R42 Dizziness and giddiness: Secondary | ICD-10-CM | POA: Diagnosis not present

## 2018-01-01 DIAGNOSIS — R110 Nausea: Secondary | ICD-10-CM | POA: Diagnosis not present

## 2018-01-01 DIAGNOSIS — Z79899 Other long term (current) drug therapy: Secondary | ICD-10-CM | POA: Diagnosis not present

## 2018-01-01 DIAGNOSIS — H6593 Unspecified nonsuppurative otitis media, bilateral: Secondary | ICD-10-CM | POA: Diagnosis not present

## 2018-01-01 DIAGNOSIS — R350 Frequency of micturition: Secondary | ICD-10-CM | POA: Diagnosis not present

## 2018-01-03 DIAGNOSIS — R69 Illness, unspecified: Secondary | ICD-10-CM | POA: Diagnosis not present

## 2018-01-20 DIAGNOSIS — H6690 Otitis media, unspecified, unspecified ear: Secondary | ICD-10-CM | POA: Diagnosis not present

## 2018-01-20 DIAGNOSIS — R42 Dizziness and giddiness: Secondary | ICD-10-CM | POA: Diagnosis not present

## 2018-01-26 DIAGNOSIS — G5602 Carpal tunnel syndrome, left upper limb: Secondary | ICD-10-CM | POA: Diagnosis not present

## 2018-01-26 DIAGNOSIS — G5601 Carpal tunnel syndrome, right upper limb: Secondary | ICD-10-CM | POA: Diagnosis not present

## 2018-01-26 DIAGNOSIS — M79641 Pain in right hand: Secondary | ICD-10-CM | POA: Diagnosis not present

## 2018-02-04 DIAGNOSIS — G5601 Carpal tunnel syndrome, right upper limb: Secondary | ICD-10-CM | POA: Diagnosis not present

## 2018-02-18 DIAGNOSIS — M25631 Stiffness of right wrist, not elsewhere classified: Secondary | ICD-10-CM | POA: Diagnosis not present

## 2018-03-10 DIAGNOSIS — M25631 Stiffness of right wrist, not elsewhere classified: Secondary | ICD-10-CM | POA: Diagnosis not present

## 2018-03-10 DIAGNOSIS — M65331 Trigger finger, right middle finger: Secondary | ICD-10-CM | POA: Insufficient documentation

## 2018-03-10 DIAGNOSIS — M1812 Unilateral primary osteoarthritis of first carpometacarpal joint, left hand: Secondary | ICD-10-CM | POA: Diagnosis not present

## 2018-03-10 DIAGNOSIS — Z5189 Encounter for other specified aftercare: Secondary | ICD-10-CM | POA: Diagnosis not present

## 2018-03-10 DIAGNOSIS — G5601 Carpal tunnel syndrome, right upper limb: Secondary | ICD-10-CM | POA: Diagnosis not present

## 2018-03-10 DIAGNOSIS — M79641 Pain in right hand: Secondary | ICD-10-CM | POA: Diagnosis not present

## 2018-03-10 DIAGNOSIS — M79642 Pain in left hand: Secondary | ICD-10-CM | POA: Diagnosis not present

## 2018-03-10 DIAGNOSIS — G5602 Carpal tunnel syndrome, left upper limb: Secondary | ICD-10-CM | POA: Diagnosis not present

## 2018-05-02 DIAGNOSIS — M810 Age-related osteoporosis without current pathological fracture: Secondary | ICD-10-CM | POA: Diagnosis not present

## 2018-05-02 DIAGNOSIS — M1990 Unspecified osteoarthritis, unspecified site: Secondary | ICD-10-CM | POA: Diagnosis not present

## 2018-05-02 DIAGNOSIS — E7800 Pure hypercholesterolemia, unspecified: Secondary | ICD-10-CM | POA: Diagnosis not present

## 2018-05-02 DIAGNOSIS — Z720 Tobacco use: Secondary | ICD-10-CM | POA: Diagnosis not present

## 2018-05-02 DIAGNOSIS — Z0000 Encounter for general adult medical examination without abnormal findings: Secondary | ICD-10-CM | POA: Diagnosis not present

## 2018-05-02 DIAGNOSIS — Z1389 Encounter for screening for other disorder: Secondary | ICD-10-CM | POA: Diagnosis not present

## 2018-05-19 ENCOUNTER — Other Ambulatory Visit: Payer: Self-pay | Admitting: *Deleted

## 2018-05-19 NOTE — Telephone Encounter (Signed)
Medication refill request: xanax Last AEX:  08/06/17 SM Next AEX: 12/09/18  Last MMG (if hormonal medication request): 12/06/17 BIRADS1:Neg  Refill authorized: 08/06/17 #[id number]. Today please advise.

## 2018-05-23 MED ORDER — ALPRAZOLAM 0.25 MG PO TABS: 0.2500 mg | ORAL_TABLET | ORAL | 0 refills | Status: DC | PRN

## 2018-05-23 NOTE — Telephone Encounter (Signed)
Patient checking status of refill request. °

## 2018-05-23 NOTE — Telephone Encounter (Signed)
Rx faxed to pharmacy  

## 2018-06-01 DIAGNOSIS — E7800 Pure hypercholesterolemia, unspecified: Secondary | ICD-10-CM | POA: Diagnosis not present

## 2018-06-16 DIAGNOSIS — R69 Illness, unspecified: Secondary | ICD-10-CM | POA: Diagnosis not present

## 2018-06-23 DIAGNOSIS — E7800 Pure hypercholesterolemia, unspecified: Secondary | ICD-10-CM | POA: Diagnosis not present

## 2018-06-23 DIAGNOSIS — H524 Presbyopia: Secondary | ICD-10-CM | POA: Diagnosis not present

## 2018-06-27 DIAGNOSIS — M79641 Pain in right hand: Secondary | ICD-10-CM | POA: Diagnosis not present

## 2018-06-27 DIAGNOSIS — M65331 Trigger finger, right middle finger: Secondary | ICD-10-CM | POA: Diagnosis not present

## 2018-06-27 DIAGNOSIS — M65332 Trigger finger, left middle finger: Secondary | ICD-10-CM | POA: Diagnosis not present

## 2018-06-27 DIAGNOSIS — M79642 Pain in left hand: Secondary | ICD-10-CM | POA: Diagnosis not present

## 2018-06-27 DIAGNOSIS — M1812 Unilateral primary osteoarthritis of first carpometacarpal joint, left hand: Secondary | ICD-10-CM | POA: Diagnosis not present

## 2018-06-29 DIAGNOSIS — M810 Age-related osteoporosis without current pathological fracture: Secondary | ICD-10-CM | POA: Diagnosis not present

## 2018-07-04 DIAGNOSIS — L821 Other seborrheic keratosis: Secondary | ICD-10-CM | POA: Diagnosis not present

## 2018-07-04 DIAGNOSIS — D225 Melanocytic nevi of trunk: Secondary | ICD-10-CM | POA: Diagnosis not present

## 2018-07-04 DIAGNOSIS — Z85820 Personal history of malignant melanoma of skin: Secondary | ICD-10-CM | POA: Diagnosis not present

## 2018-07-04 DIAGNOSIS — L814 Other melanin hyperpigmentation: Secondary | ICD-10-CM | POA: Diagnosis not present

## 2018-07-07 DIAGNOSIS — R69 Illness, unspecified: Secondary | ICD-10-CM | POA: Diagnosis not present

## 2018-07-25 DIAGNOSIS — G5602 Carpal tunnel syndrome, left upper limb: Secondary | ICD-10-CM | POA: Diagnosis not present

## 2018-07-25 DIAGNOSIS — M79641 Pain in right hand: Secondary | ICD-10-CM | POA: Diagnosis not present

## 2018-07-25 DIAGNOSIS — M79642 Pain in left hand: Secondary | ICD-10-CM | POA: Diagnosis not present

## 2018-07-25 DIAGNOSIS — M65332 Trigger finger, left middle finger: Secondary | ICD-10-CM | POA: Diagnosis not present

## 2018-07-25 DIAGNOSIS — M65331 Trigger finger, right middle finger: Secondary | ICD-10-CM | POA: Diagnosis not present

## 2018-09-21 DIAGNOSIS — R69 Illness, unspecified: Secondary | ICD-10-CM | POA: Diagnosis not present

## 2018-10-03 DIAGNOSIS — R69 Illness, unspecified: Secondary | ICD-10-CM | POA: Diagnosis not present

## 2018-10-24 DIAGNOSIS — R69 Illness, unspecified: Secondary | ICD-10-CM | POA: Diagnosis not present

## 2018-10-26 DIAGNOSIS — G5601 Carpal tunnel syndrome, right upper limb: Secondary | ICD-10-CM | POA: Diagnosis not present

## 2018-10-26 DIAGNOSIS — M65331 Trigger finger, right middle finger: Secondary | ICD-10-CM | POA: Diagnosis not present

## 2018-10-26 DIAGNOSIS — M65332 Trigger finger, left middle finger: Secondary | ICD-10-CM | POA: Diagnosis not present

## 2018-10-26 DIAGNOSIS — G5602 Carpal tunnel syndrome, left upper limb: Secondary | ICD-10-CM | POA: Diagnosis not present

## 2018-10-26 DIAGNOSIS — M13841 Other specified arthritis, right hand: Secondary | ICD-10-CM | POA: Diagnosis not present

## 2018-11-12 DIAGNOSIS — Z23 Encounter for immunization: Secondary | ICD-10-CM | POA: Diagnosis not present

## 2018-11-23 ENCOUNTER — Other Ambulatory Visit: Payer: Self-pay | Admitting: Obstetrics & Gynecology

## 2018-11-23 DIAGNOSIS — Z01419 Encounter for gynecological examination (general) (routine) without abnormal findings: Secondary | ICD-10-CM

## 2018-11-23 NOTE — Telephone Encounter (Signed)
Patient is asking for a refill of Alprazolam. [facility] [address].

## 2018-11-23 NOTE — Telephone Encounter (Signed)
Med refill request: Xanax 0.25mg  Last AEX: 08/06/2017 Next AEX:12/09/2018 Last MMG (if hormonal med) n/a Refill authorized: 30tabs/ 0RF, orders pended if approved

## 2018-11-24 ENCOUNTER — Other Ambulatory Visit: Payer: Self-pay | Admitting: Obstetrics & Gynecology

## 2018-11-24 DIAGNOSIS — Z01419 Encounter for gynecological examination (general) (routine) without abnormal findings: Secondary | ICD-10-CM

## 2018-11-24 MED ORDER — ALPRAZOLAM 0.25 MG PO TABS: 0.2500 mg | ORAL_TABLET | ORAL | 0 refills | Status: DC | PRN

## 2018-12-06 ENCOUNTER — Other Ambulatory Visit: Payer: Self-pay

## 2018-12-08 DIAGNOSIS — Z1231 Encounter for screening mammogram for malignant neoplasm of breast: Secondary | ICD-10-CM | POA: Diagnosis not present

## 2018-12-09 ENCOUNTER — Other Ambulatory Visit: Payer: Self-pay

## 2018-12-09 ENCOUNTER — Ambulatory Visit (INDEPENDENT_AMBULATORY_CARE_PROVIDER_SITE_OTHER): Payer: Medicare HMO | Admitting: Obstetrics & Gynecology

## 2018-12-09 ENCOUNTER — Encounter: Payer: Self-pay | Admitting: Obstetrics & Gynecology

## 2018-12-09 VITALS — BP 110/70 | HR 80 | Temp 97.9°F | Resp 12 | Ht 65.75 in | Wt 137.8 lb

## 2018-12-09 DIAGNOSIS — Z01419 Encounter for gynecological examination (general) (routine) without abnormal findings: Secondary | ICD-10-CM | POA: Diagnosis not present

## 2018-12-09 NOTE — Progress Notes (Signed)
68 y.o. G3P3 Divorced White or Caucasian female here for annual exam.  Having carpel tunnel on left done sometime in the next few weeks.  She is also doing to have trigger finger done in the office as well.    Denies vaginal bleeding.    Patient's last menstrual period was 01/06/1999.          Sexually active: No.  The current method of family planning is post menopausal status.    Exercising: No.  The patient does not participate in regular exercise at present. Smoker:  yes  Health Maintenance: Pap:  05/15/16 Neg              11/13/13 neg  History of abnormal Pap:  no MMG:  12/08/18 WNL  Colonoscopy:  2014 f/u 10 years  BMD:  06/29/18  Osteoporosis.  Discussed with Dr. Laurann Montana.  Declined treatment.  On Botera calcium supplement.  TDaP:  2018 Pneumonia vaccine(s):  Done prevnar.  Does need pneumovax vaccination Shingrix: none Hep C testing: Donates blood and had testing with Dr. Laurann Montana Screening Labs: PCP   reports that she has been smoking cigarettes. She has been smoking about 0.50 packs per day. She has never used smokeless tobacco. She reports current alcohol use. She reports that she does not use drugs.  Past Medical History:  Diagnosis Date  . Abnormal uterine bleeding    x2 after childbirth x1  . Anemia   . Anxiety   . Arthritis    right knee/using Voltaren gel  . Breast discharge    Bilateral 25 years ago  . Carpal tunnel syndrome, bilateral   . Elevated lipids   . Fibroid   . Irregular heart beat    echo/stress test  (neg) 02/2008  . Melanoma in situ of face ([place])    nose  . Osteopenia   . Osteopenia   . Positive TB test    x-ray normal  . Transfusion history 1977    Past Surgical History:  Procedure Laterality Date  . BACK SURGERY  1986  . CARPAL TUNNEL RELEASE Right   . DILATION AND CURETTAGE OF UTERUS     x3  . HERNIA REPAIR Bilateral   . KNEE SURGERY Right 04/2015  . LAPAROSCOPY  1998  . SKIN BIOPSY     on nose  . TUBAL LIGATION      Current  Outpatient Medications  Medication Sig Dispense Refill  . ALPRAZolam (XANAX) 0.25 MG tablet Take 1 tablet (0.25 mg total) by mouth as needed for anxiety. 30 tablet 0  . Ascorbic Acid (VITAMIN C) 100 MG tablet daily.    Marland Kitchen atorvastatin (LIPITOR) 20 MG tablet Take 10 mg by mouth daily.     Marland Kitchen CALCIUM-MAGNESIUM-VITAMIN D PO Take by mouth daily. doTerra    . Multiple Vitamin (MULTI-VITAMIN PO) Take by mouth daily.     No current facility-administered medications for this visit.     Family History  Problem Relation Age of Onset  . Heart attack Maternal Grandmother     Review of Systems  All other systems reviewed and are negative.   Exam:   BP 110/70 (BP Location: Right Arm, Patient Position: Sitting, Cuff Size: Normal)   Pulse 80   Temp 97.9 F (36.6 C) (Temporal)   Resp 12   Ht 5' 5.75" (1.67 m)   Wt 137 lb 12.8 oz (62.5 kg)   LMP 01/06/1999   BMI 22.41 kg/m      Height: 5' 5.75" (167 cm)  Ht Readings from Last 3 Encounters:  12/09/18 5' 5.75" (1.67 m)  08/06/17 5' 5.75" (1.67 m)  07/15/17 5' 5.75" (1.67 m)    General appearance: alert, cooperative and appears stated age Head: Normocephalic, without obvious abnormality, atraumatic Neck: no adenopathy, supple, symmetrical, trachea midline and thyroid normal to inspection and palpation Lungs: clear to auscultation bilaterally Breasts: normal appearance, no masses or tenderness Heart: regular rate and rhythm Abdomen: soft, non-tender; bowel sounds normal; no masses,  no organomegaly Extremities: extremities normal, atraumatic, no cyanosis or edema Skin: Skin color, texture, turgor normal. No rashes or lesions Lymph nodes: Cervical, supraclavicular, and axillary nodes normal. No abnormal inguinal nodes palpated Neurologic: Grossly normal   Pelvic: External genitalia:  no lesions              Urethra:  normal appearing urethra with no masses, tenderness or lesions              Bartholins and Skenes: normal                  Vagina: normal appearing vagina with normal color and discharge, no lesions              Cervix: no lesions              Pap taken: No. Bimanual Exam:  Uterus:  normal size, contour, position, consistency, mobility, non-tender              Adnexa: normal adnexa and no mass, fullness, tenderness               Rectovaginal: Confirms               Anus:  normal sphincter tone, no lesions  Chaperone was present for exam.  A:  Well Woman with normal exam PMP, no HRT H/o anxiety Osteoporosis, declines medication at this point H/o melanoma in situ treated at [facility].  Still being followed every six months. Rectocele  P:   Mammogram guidelines reviewed.  Just done yesterday. pap smear neg 2018.  Not indicated today. Colonoscopy 2014 BMD will be repeated in about 2 years Does not need RF for Xanax.  Received RF request this week.  She usually gets #30 and has possibly one refill in a year Lab work done with Dr. Laurann Montana Vaccines reviewed.  She is going to have the pneumovax in 2021. Return annually or prn

## 2019-01-02 DIAGNOSIS — L814 Other melanin hyperpigmentation: Secondary | ICD-10-CM | POA: Diagnosis not present

## 2019-01-02 DIAGNOSIS — D225 Melanocytic nevi of trunk: Secondary | ICD-10-CM | POA: Diagnosis not present

## 2019-01-02 DIAGNOSIS — L820 Inflamed seborrheic keratosis: Secondary | ICD-10-CM | POA: Diagnosis not present

## 2019-01-02 DIAGNOSIS — Z85820 Personal history of malignant melanoma of skin: Secondary | ICD-10-CM | POA: Diagnosis not present

## 2019-01-02 DIAGNOSIS — L821 Other seborrheic keratosis: Secondary | ICD-10-CM | POA: Diagnosis not present

## 2019-01-02 DIAGNOSIS — Z23 Encounter for immunization: Secondary | ICD-10-CM | POA: Diagnosis not present

## 2019-01-02 DIAGNOSIS — L570 Actinic keratosis: Secondary | ICD-10-CM | POA: Diagnosis not present

## 2019-01-10 DIAGNOSIS — R69 Illness, unspecified: Secondary | ICD-10-CM | POA: Diagnosis not present

## 2019-01-26 DIAGNOSIS — Z4789 Encounter for other orthopedic aftercare: Secondary | ICD-10-CM | POA: Diagnosis not present

## 2019-01-26 DIAGNOSIS — M65331 Trigger finger, right middle finger: Secondary | ICD-10-CM | POA: Diagnosis not present

## 2019-02-09 DIAGNOSIS — M25641 Stiffness of right hand, not elsewhere classified: Secondary | ICD-10-CM | POA: Diagnosis not present

## 2019-04-11 DIAGNOSIS — R69 Illness, unspecified: Secondary | ICD-10-CM | POA: Diagnosis not present

## 2019-04-19 DIAGNOSIS — G5602 Carpal tunnel syndrome, left upper limb: Secondary | ICD-10-CM | POA: Diagnosis not present

## 2019-04-19 DIAGNOSIS — G5601 Carpal tunnel syndrome, right upper limb: Secondary | ICD-10-CM | POA: Diagnosis not present

## 2019-04-19 DIAGNOSIS — M79642 Pain in left hand: Secondary | ICD-10-CM | POA: Diagnosis not present

## 2019-04-19 DIAGNOSIS — Z4789 Encounter for other orthopedic aftercare: Secondary | ICD-10-CM | POA: Diagnosis not present

## 2019-04-19 DIAGNOSIS — M65332 Trigger finger, left middle finger: Secondary | ICD-10-CM | POA: Diagnosis not present

## 2019-04-19 DIAGNOSIS — M1812 Unilateral primary osteoarthritis of first carpometacarpal joint, left hand: Secondary | ICD-10-CM | POA: Diagnosis not present

## 2019-05-03 DIAGNOSIS — Z720 Tobacco use: Secondary | ICD-10-CM | POA: Diagnosis not present

## 2019-05-03 DIAGNOSIS — Z0000 Encounter for general adult medical examination without abnormal findings: Secondary | ICD-10-CM | POA: Diagnosis not present

## 2019-05-03 DIAGNOSIS — M810 Age-related osteoporosis without current pathological fracture: Secondary | ICD-10-CM | POA: Diagnosis not present

## 2019-05-03 DIAGNOSIS — H6120 Impacted cerumen, unspecified ear: Secondary | ICD-10-CM | POA: Diagnosis not present

## 2019-05-03 DIAGNOSIS — Z1389 Encounter for screening for other disorder: Secondary | ICD-10-CM | POA: Diagnosis not present

## 2019-05-03 DIAGNOSIS — E7800 Pure hypercholesterolemia, unspecified: Secondary | ICD-10-CM | POA: Diagnosis not present

## 2019-05-03 DIAGNOSIS — L209 Atopic dermatitis, unspecified: Secondary | ICD-10-CM | POA: Diagnosis not present

## 2019-05-03 DIAGNOSIS — M1990 Unspecified osteoarthritis, unspecified site: Secondary | ICD-10-CM | POA: Diagnosis not present

## 2019-05-03 DIAGNOSIS — C4330 Malignant melanoma of unspecified part of face: Secondary | ICD-10-CM | POA: Diagnosis not present

## 2019-05-03 DIAGNOSIS — Z7189 Other specified counseling: Secondary | ICD-10-CM | POA: Diagnosis not present

## 2019-06-15 DIAGNOSIS — L570 Actinic keratosis: Secondary | ICD-10-CM | POA: Diagnosis not present

## 2019-06-15 DIAGNOSIS — L820 Inflamed seborrheic keratosis: Secondary | ICD-10-CM | POA: Diagnosis not present

## 2019-06-15 DIAGNOSIS — L821 Other seborrheic keratosis: Secondary | ICD-10-CM | POA: Diagnosis not present

## 2019-06-15 DIAGNOSIS — L578 Other skin changes due to chronic exposure to nonionizing radiation: Secondary | ICD-10-CM | POA: Diagnosis not present

## 2019-06-15 DIAGNOSIS — L814 Other melanin hyperpigmentation: Secondary | ICD-10-CM | POA: Diagnosis not present

## 2019-06-15 DIAGNOSIS — Z86006 Personal history of melanoma in-situ: Secondary | ICD-10-CM | POA: Diagnosis not present

## 2019-06-15 DIAGNOSIS — D225 Melanocytic nevi of trunk: Secondary | ICD-10-CM | POA: Diagnosis not present

## 2019-07-12 DIAGNOSIS — R69 Illness, unspecified: Secondary | ICD-10-CM | POA: Diagnosis not present

## 2019-07-20 DIAGNOSIS — Z720 Tobacco use: Secondary | ICD-10-CM | POA: Diagnosis not present

## 2019-07-20 DIAGNOSIS — G8929 Other chronic pain: Secondary | ICD-10-CM | POA: Diagnosis not present

## 2019-07-20 DIAGNOSIS — K5900 Constipation, unspecified: Secondary | ICD-10-CM | POA: Diagnosis not present

## 2019-07-20 DIAGNOSIS — N393 Stress incontinence (female) (male): Secondary | ICD-10-CM | POA: Diagnosis not present

## 2019-07-20 DIAGNOSIS — E785 Hyperlipidemia, unspecified: Secondary | ICD-10-CM | POA: Diagnosis not present

## 2019-07-20 DIAGNOSIS — I739 Peripheral vascular disease, unspecified: Secondary | ICD-10-CM | POA: Diagnosis not present

## 2019-07-20 DIAGNOSIS — M2550 Pain in unspecified joint: Secondary | ICD-10-CM | POA: Diagnosis not present

## 2019-07-20 DIAGNOSIS — R69 Illness, unspecified: Secondary | ICD-10-CM | POA: Diagnosis not present

## 2019-07-20 DIAGNOSIS — Z791 Long term (current) use of non-steroidal anti-inflammatories (NSAID): Secondary | ICD-10-CM | POA: Diagnosis not present

## 2019-07-25 DIAGNOSIS — R69 Illness, unspecified: Secondary | ICD-10-CM | POA: Diagnosis not present

## 2019-07-28 ENCOUNTER — Other Ambulatory Visit: Payer: Self-pay

## 2019-07-28 MED ORDER — ALPRAZOLAM 0.25 MG PO TABS: 0.2500 mg | ORAL_TABLET | ORAL | 0 refills | Status: DC | PRN

## 2019-07-28 NOTE — Telephone Encounter (Signed)
Medication refill request: Xanax Last AEX:  12/09/18 SM Next AEX: 02/27/20 Last MMG (if hormonal medication request): n/a Refill authorized: Today, please advise

## 2019-07-28 NOTE — Telephone Encounter (Signed)
Patient is calling in regards to refill for Xanax.

## 2019-08-01 DIAGNOSIS — K1329 Other disturbances of oral epithelium, including tongue: Secondary | ICD-10-CM | POA: Diagnosis not present

## 2019-09-01 DIAGNOSIS — G5602 Carpal tunnel syndrome, left upper limb: Secondary | ICD-10-CM | POA: Diagnosis not present

## 2019-09-01 DIAGNOSIS — M65332 Trigger finger, left middle finger: Secondary | ICD-10-CM | POA: Diagnosis not present

## 2019-09-05 DIAGNOSIS — M272 Inflammatory conditions of jaws: Secondary | ICD-10-CM | POA: Diagnosis not present

## 2019-09-15 DIAGNOSIS — M25642 Stiffness of left hand, not elsewhere classified: Secondary | ICD-10-CM | POA: Diagnosis not present

## 2019-10-26 DIAGNOSIS — M79641 Pain in right hand: Secondary | ICD-10-CM | POA: Diagnosis not present

## 2019-10-26 DIAGNOSIS — M13841 Other specified arthritis, right hand: Secondary | ICD-10-CM | POA: Diagnosis not present

## 2019-10-26 DIAGNOSIS — Z4789 Encounter for other orthopedic aftercare: Secondary | ICD-10-CM | POA: Diagnosis not present

## 2019-10-26 DIAGNOSIS — M1812 Unilateral primary osteoarthritis of first carpometacarpal joint, left hand: Secondary | ICD-10-CM | POA: Diagnosis not present

## 2019-10-26 DIAGNOSIS — M65841 Other synovitis and tenosynovitis, right hand: Secondary | ICD-10-CM | POA: Diagnosis not present

## 2019-10-26 DIAGNOSIS — R69 Illness, unspecified: Secondary | ICD-10-CM | POA: Diagnosis not present

## 2019-11-11 DIAGNOSIS — Z23 Encounter for immunization: Secondary | ICD-10-CM | POA: Diagnosis not present

## 2019-12-14 DIAGNOSIS — Z1231 Encounter for screening mammogram for malignant neoplasm of breast: Secondary | ICD-10-CM | POA: Diagnosis not present

## 2020-02-27 ENCOUNTER — Ambulatory Visit: Payer: Medicare HMO | Admitting: Obstetrics & Gynecology

## 2020-03-20 ENCOUNTER — Other Ambulatory Visit: Payer: Self-pay | Admitting: Obstetrics & Gynecology

## 2020-03-25 DIAGNOSIS — M65842 Other synovitis and tenosynovitis, left hand: Secondary | ICD-10-CM | POA: Diagnosis not present

## 2020-03-25 DIAGNOSIS — M65841 Other synovitis and tenosynovitis, right hand: Secondary | ICD-10-CM | POA: Diagnosis not present

## 2020-05-01 ENCOUNTER — Encounter (HOSPITAL_BASED_OUTPATIENT_CLINIC_OR_DEPARTMENT_OTHER): Payer: Self-pay | Admitting: Obstetrics & Gynecology

## 2020-05-01 ENCOUNTER — Ambulatory Visit (INDEPENDENT_AMBULATORY_CARE_PROVIDER_SITE_OTHER): Payer: Medicare HMO | Admitting: Obstetrics & Gynecology

## 2020-05-01 ENCOUNTER — Other Ambulatory Visit: Payer: Self-pay

## 2020-05-01 ENCOUNTER — Other Ambulatory Visit (HOSPITAL_COMMUNITY)
Admission: RE | Admit: 2020-05-01 | Discharge: 2020-05-01 | Disposition: A | Discharge status: Home / Self Care | Payer: Medicare HMO | Source: Ambulatory Visit | Attending: Obstetrics & Gynecology | Admitting: Obstetrics & Gynecology

## 2020-05-01 VITALS — BP 112/72 | HR 74 | Ht 65.0 in | Wt 134.0 lb

## 2020-05-01 DIAGNOSIS — Z01419 Encounter for gynecological examination (general) (routine) without abnormal findings: Secondary | ICD-10-CM

## 2020-05-01 DIAGNOSIS — Z780 Asymptomatic menopausal state: Secondary | ICD-10-CM | POA: Diagnosis not present

## 2020-05-01 DIAGNOSIS — M810 Age-related osteoporosis without current pathological fracture: Secondary | ICD-10-CM

## 2020-05-01 DIAGNOSIS — N816 Rectocele: Secondary | ICD-10-CM

## 2020-05-01 DIAGNOSIS — Z124 Encounter for screening for malignant neoplasm of cervix: Secondary | ICD-10-CM | POA: Diagnosis not present

## 2020-05-01 DIAGNOSIS — Z8659 Personal history of other mental and behavioral disorders: Secondary | ICD-10-CM

## 2020-05-01 DIAGNOSIS — R69 Illness, unspecified: Secondary | ICD-10-CM | POA: Diagnosis not present

## 2020-05-01 NOTE — Progress Notes (Signed)
70 y.o. G3P3 Divorced White or Caucasian female here for breast and pelvic exam.  Denies vaginal bleeding.  Had BMD 06/2018 showing osteoporosis.  Taking a Dotera supplement for bone support.  Dr. Laurann Montana did recommend treatment but she has declined.  Will have it repeated this summer.  Still seeing dermatologist yearly.  Last appt was 06/2019 with Dr. Renda Rolls.  Uses Xanax prn.  Aware more than twice weekly increases risk for tolerance.  Last RF was March and she takes only 1/2 of 0.25mg  dosage.    Patient's last menstrual period was 01/06/1999.          Sexually active: No.  H/O STD:  no  Health Maintenance: PCP:  Dr. Laurann Montana.  Has wellness appt scheduled for 05/09/20.  Did blood work will be done that day.  Vaccines are up to date:  Pneumonia x 1 has been done Colonoscopy:  03/18/12 Dr. Amedeo Plenty, negative.  Follow up 10 year MMG:  06/2018, osteoporosis BMD:  06/2019 Last pap smear:  2018.   H/o abnormal pap smear:  no   reports that she has been smoking cigarettes. She has been smoking about 0.50 packs per day. She has never used smokeless tobacco. She reports current alcohol use. She reports that she does not use drugs.  Past Medical History:  Diagnosis Date  . Abnormal uterine bleeding    x2 after childbirth x1  . Anemia   . Anxiety   . Arthritis    right knee/using Voltaren gel  . Breast discharge    Bilateral 25 years ago  . Carpal tunnel syndrome, bilateral   . Elevated lipids   . Fibroid   . Irregular heart beat    echo/stress test  (neg) 02/2008  . Melanoma in situ of face ([place])    nose  . Osteopenia   . Osteopenia   . Positive TB test    x-ray normal  . Transfusion history 1977    Past Surgical History:  Procedure Laterality Date  . BACK SURGERY  1986  . CARPAL TUNNEL RELEASE Right    01/22/2018  . DILATION AND CURETTAGE OF UTERUS     x3  . HERNIA REPAIR Bilateral   . KNEE SURGERY Right 04/2015  . LAPAROSCOPY  1998  . SKIN BIOPSY     on nose  . TUBAL  LIGATION      Current Outpatient Medications  Medication Sig Dispense Refill  . ALPRAZolam (XANAX) 0.25 MG tablet TAKE 1 TABLET BY MOUTH EVERY DAY AS NEEDED FOR ANXIETY 30 tablet 0  . Ascorbic Acid (VITAMIN C) 100 MG tablet daily.    Marland Kitchen atorvastatin (LIPITOR) 20 MG tablet Take 10 mg by mouth daily.     Marland Kitchen CALCIUM-MAGNESIUM-VITAMIN D PO Take by mouth daily. doTerra    . Multiple Vitamin (MULTI-VITAMIN PO) Take by mouth daily.    . Zinc Sulfate (ZINC 15 PO) Take by mouth.     No current facility-administered medications for this visit.    Family History  Problem Relation Age of Onset  . Heart attack Maternal Grandmother     Review of Systems  All other systems reviewed and are negative.   Exam:   BP 112/72   Pulse 74   Ht 5\' 5"  (1.651 m)   Wt 134 lb (60.8 kg)   LMP 01/06/1999   BMI 22.30 kg/m   Height: 5\' 5"  (165.1 cm)  General appearance: alert, cooperative and appears stated age Breasts: normal appearance, no masses or tenderness Abdomen: soft, non-tender;  bowel sounds normal; no masses,  no organomegaly Lymph nodes: Cervical, supraclavicular, and axillary nodes normal.  No abnormal inguinal nodes palpated Neurologic: Grossly normal  Pelvic: External genitalia:  no lesions              Urethra:  normal appearing urethra with no masses, tenderness or lesions              Bartholins and Skenes: normal                 Vagina: normal appearing vagina with atrophic changes and no discharge, no lesions              Cervix: no lesions              Pap taken: Yes.   Bimanual Exam:  Uterus:  normal size, contour, position, consistency, mobility, non-tender              Adnexa: normal adnexa               Rectovaginal: Confirms               Anus:  normal sphincter tone, no lesions  Chaperone, Prince Rome, CMA, was present for exam.  Assessment/Plan: 1. Encntr for gyn exam (general) (routine) w/o abn findings - Cytology - PAP( [place]) - PR OBTAINING SCREEN PAP SMEAR  (guidelines reviewed.  Pt desires until age 89) - MMG done 2021.  Will call [facility] to get report. - BMd 2020.  Due this year.  2. Postmenopausal - no HRT  3. History of anxiety - does not need RF.  Typically gets #30/1RF each year.  Does not need today.  Done 03/21/2020  4. Rectocele  5. Age-related osteoporosis without current pathological fracture - need BMD again this year.  Order will be [facility] to [facility] for this.  6.  Smoker

## 2020-05-06 ENCOUNTER — Ambulatory Visit (HOSPITAL_BASED_OUTPATIENT_CLINIC_OR_DEPARTMENT_OTHER): Payer: 59 | Admitting: Obstetrics & Gynecology

## 2020-05-06 LAB — CYTOLOGY - PAP: Diagnosis: NEGATIVE

## 2020-05-08 ENCOUNTER — Ambulatory Visit (HOSPITAL_BASED_OUTPATIENT_CLINIC_OR_DEPARTMENT_OTHER): Payer: 59 | Admitting: Obstetrics & Gynecology

## 2020-05-10 DIAGNOSIS — E7800 Pure hypercholesterolemia, unspecified: Secondary | ICD-10-CM | POA: Diagnosis not present

## 2020-05-10 DIAGNOSIS — Z1389 Encounter for screening for other disorder: Secondary | ICD-10-CM | POA: Diagnosis not present

## 2020-05-10 DIAGNOSIS — M1990 Unspecified osteoarthritis, unspecified site: Secondary | ICD-10-CM | POA: Diagnosis not present

## 2020-05-10 DIAGNOSIS — M810 Age-related osteoporosis without current pathological fracture: Secondary | ICD-10-CM | POA: Diagnosis not present

## 2020-05-10 DIAGNOSIS — Z0000 Encounter for general adult medical examination without abnormal findings: Secondary | ICD-10-CM | POA: Diagnosis not present

## 2020-05-10 DIAGNOSIS — Z720 Tobacco use: Secondary | ICD-10-CM | POA: Diagnosis not present

## 2020-05-10 DIAGNOSIS — C4330 Malignant melanoma of unspecified part of face: Secondary | ICD-10-CM | POA: Diagnosis not present

## 2020-06-27 DIAGNOSIS — E7800 Pure hypercholesterolemia, unspecified: Secondary | ICD-10-CM | POA: Diagnosis not present

## 2020-06-27 DIAGNOSIS — H35023 Exudative retinopathy, bilateral: Secondary | ICD-10-CM | POA: Diagnosis not present

## 2020-06-27 DIAGNOSIS — H524 Presbyopia: Secondary | ICD-10-CM | POA: Diagnosis not present

## 2020-07-12 DIAGNOSIS — L578 Other skin changes due to chronic exposure to nonionizing radiation: Secondary | ICD-10-CM | POA: Diagnosis not present

## 2020-07-12 DIAGNOSIS — M67449 Ganglion, unspecified hand: Secondary | ICD-10-CM | POA: Diagnosis not present

## 2020-07-12 DIAGNOSIS — L814 Other melanin hyperpigmentation: Secondary | ICD-10-CM | POA: Diagnosis not present

## 2020-07-12 DIAGNOSIS — L820 Inflamed seborrheic keratosis: Secondary | ICD-10-CM | POA: Diagnosis not present

## 2020-07-12 DIAGNOSIS — D225 Melanocytic nevi of trunk: Secondary | ICD-10-CM | POA: Diagnosis not present

## 2020-07-12 DIAGNOSIS — Z86006 Personal history of melanoma in-situ: Secondary | ICD-10-CM | POA: Diagnosis not present

## 2020-07-12 DIAGNOSIS — L821 Other seborrheic keratosis: Secondary | ICD-10-CM | POA: Diagnosis not present

## 2020-07-16 DIAGNOSIS — M810 Age-related osteoporosis without current pathological fracture: Secondary | ICD-10-CM | POA: Diagnosis not present

## 2020-08-08 DIAGNOSIS — L02224 Furuncle of groin: Secondary | ICD-10-CM | POA: Diagnosis not present

## 2020-08-16 DIAGNOSIS — H25013 Cortical age-related cataract, bilateral: Secondary | ICD-10-CM | POA: Diagnosis not present

## 2020-08-16 DIAGNOSIS — H2513 Age-related nuclear cataract, bilateral: Secondary | ICD-10-CM | POA: Diagnosis not present

## 2020-08-16 DIAGNOSIS — H18413 Arcus senilis, bilateral: Secondary | ICD-10-CM | POA: Diagnosis not present

## 2020-08-16 DIAGNOSIS — H2511 Age-related nuclear cataract, right eye: Secondary | ICD-10-CM | POA: Diagnosis not present

## 2020-08-16 DIAGNOSIS — H25043 Posterior subcapsular polar age-related cataract, bilateral: Secondary | ICD-10-CM | POA: Diagnosis not present

## 2020-09-05 DIAGNOSIS — M65842 Other synovitis and tenosynovitis, left hand: Secondary | ICD-10-CM | POA: Diagnosis not present

## 2020-09-05 DIAGNOSIS — M79641 Pain in right hand: Secondary | ICD-10-CM | POA: Diagnosis not present

## 2020-09-05 DIAGNOSIS — M65841 Other synovitis and tenosynovitis, right hand: Secondary | ICD-10-CM | POA: Diagnosis not present

## 2020-09-05 DIAGNOSIS — M79642 Pain in left hand: Secondary | ICD-10-CM | POA: Diagnosis not present

## 2020-09-05 DIAGNOSIS — M65322 Trigger finger, left index finger: Secondary | ICD-10-CM | POA: Diagnosis not present

## 2020-09-05 DIAGNOSIS — M65342 Trigger finger, left ring finger: Secondary | ICD-10-CM | POA: Diagnosis not present

## 2020-10-09 DIAGNOSIS — M65342 Trigger finger, left ring finger: Secondary | ICD-10-CM | POA: Diagnosis not present

## 2020-10-09 DIAGNOSIS — M65322 Trigger finger, left index finger: Secondary | ICD-10-CM | POA: Diagnosis not present

## 2020-10-11 DIAGNOSIS — H2513 Age-related nuclear cataract, bilateral: Secondary | ICD-10-CM | POA: Diagnosis not present

## 2020-10-11 DIAGNOSIS — H2511 Age-related nuclear cataract, right eye: Secondary | ICD-10-CM | POA: Diagnosis not present

## 2020-10-11 DIAGNOSIS — H2512 Age-related nuclear cataract, left eye: Secondary | ICD-10-CM | POA: Diagnosis not present

## 2020-10-19 DIAGNOSIS — Z23 Encounter for immunization: Secondary | ICD-10-CM | POA: Diagnosis not present

## 2020-11-08 DIAGNOSIS — H2512 Age-related nuclear cataract, left eye: Secondary | ICD-10-CM | POA: Diagnosis not present

## 2020-11-11 DIAGNOSIS — H2513 Age-related nuclear cataract, bilateral: Secondary | ICD-10-CM | POA: Diagnosis not present

## 2020-11-14 DIAGNOSIS — E7800 Pure hypercholesterolemia, unspecified: Secondary | ICD-10-CM | POA: Diagnosis not present

## 2020-12-19 DIAGNOSIS — Z1231 Encounter for screening mammogram for malignant neoplasm of breast: Secondary | ICD-10-CM | POA: Diagnosis not present

## 2021-05-13 DIAGNOSIS — N951 Menopausal and female climacteric states: Secondary | ICD-10-CM | POA: Diagnosis not present

## 2021-05-13 DIAGNOSIS — L209 Atopic dermatitis, unspecified: Secondary | ICD-10-CM | POA: Diagnosis not present

## 2021-05-13 DIAGNOSIS — Z1331 Encounter for screening for depression: Secondary | ICD-10-CM | POA: Diagnosis not present

## 2021-05-13 DIAGNOSIS — Z0000 Encounter for general adult medical examination without abnormal findings: Secondary | ICD-10-CM | POA: Diagnosis not present

## 2021-05-13 DIAGNOSIS — Z23 Encounter for immunization: Secondary | ICD-10-CM | POA: Diagnosis not present

## 2021-05-13 DIAGNOSIS — M25551 Pain in right hip: Secondary | ICD-10-CM | POA: Diagnosis not present

## 2021-05-13 DIAGNOSIS — C4330 Malignant melanoma of unspecified part of face: Secondary | ICD-10-CM | POA: Diagnosis not present

## 2021-05-13 DIAGNOSIS — M810 Age-related osteoporosis without current pathological fracture: Secondary | ICD-10-CM | POA: Diagnosis not present

## 2021-05-13 DIAGNOSIS — Z720 Tobacco use: Secondary | ICD-10-CM | POA: Diagnosis not present

## 2021-05-13 DIAGNOSIS — M1990 Unspecified osteoarthritis, unspecified site: Secondary | ICD-10-CM | POA: Diagnosis not present

## 2021-05-13 DIAGNOSIS — E7800 Pure hypercholesterolemia, unspecified: Secondary | ICD-10-CM | POA: Diagnosis not present

## 2021-06-30 DIAGNOSIS — E7800 Pure hypercholesterolemia, unspecified: Secondary | ICD-10-CM | POA: Diagnosis not present

## 2021-06-30 DIAGNOSIS — H524 Presbyopia: Secondary | ICD-10-CM | POA: Diagnosis not present

## 2021-06-30 DIAGNOSIS — H35363 Drusen (degenerative) of macula, bilateral: Secondary | ICD-10-CM | POA: Diagnosis not present

## 2021-07-03 DIAGNOSIS — R030 Elevated blood-pressure reading, without diagnosis of hypertension: Secondary | ICD-10-CM | POA: Diagnosis not present

## 2021-07-03 DIAGNOSIS — Z85820 Personal history of malignant melanoma of skin: Secondary | ICD-10-CM | POA: Diagnosis not present

## 2021-07-03 DIAGNOSIS — Z008 Encounter for other general examination: Secondary | ICD-10-CM | POA: Diagnosis not present

## 2021-07-03 DIAGNOSIS — Z809 Family history of malignant neoplasm, unspecified: Secondary | ICD-10-CM | POA: Diagnosis not present

## 2021-07-03 DIAGNOSIS — Z8249 Family history of ischemic heart disease and other diseases of the circulatory system: Secondary | ICD-10-CM | POA: Diagnosis not present

## 2021-07-03 DIAGNOSIS — Z720 Tobacco use: Secondary | ICD-10-CM | POA: Diagnosis not present

## 2021-07-03 DIAGNOSIS — Z885 Allergy status to narcotic agent status: Secondary | ICD-10-CM | POA: Diagnosis not present

## 2021-07-03 DIAGNOSIS — E785 Hyperlipidemia, unspecified: Secondary | ICD-10-CM | POA: Diagnosis not present

## 2021-07-10 DIAGNOSIS — L578 Other skin changes due to chronic exposure to nonionizing radiation: Secondary | ICD-10-CM | POA: Diagnosis not present

## 2021-07-10 DIAGNOSIS — L814 Other melanin hyperpigmentation: Secondary | ICD-10-CM | POA: Diagnosis not present

## 2021-07-10 DIAGNOSIS — L570 Actinic keratosis: Secondary | ICD-10-CM | POA: Diagnosis not present

## 2021-07-10 DIAGNOSIS — D229 Melanocytic nevi, unspecified: Secondary | ICD-10-CM | POA: Diagnosis not present

## 2021-07-10 DIAGNOSIS — L821 Other seborrheic keratosis: Secondary | ICD-10-CM | POA: Diagnosis not present

## 2021-07-10 DIAGNOSIS — D1801 Hemangioma of skin and subcutaneous tissue: Secondary | ICD-10-CM | POA: Diagnosis not present

## 2021-07-10 DIAGNOSIS — Z86006 Personal history of melanoma in-situ: Secondary | ICD-10-CM | POA: Diagnosis not present

## 2021-07-10 DIAGNOSIS — L820 Inflamed seborrheic keratosis: Secondary | ICD-10-CM | POA: Diagnosis not present

## 2021-08-01 DIAGNOSIS — H35363 Drusen (degenerative) of macula, bilateral: Secondary | ICD-10-CM | POA: Diagnosis not present

## 2021-08-04 ENCOUNTER — Other Ambulatory Visit: Payer: Self-pay | Admitting: Obstetrics & Gynecology

## 2021-08-05 ENCOUNTER — Other Ambulatory Visit (HOSPITAL_BASED_OUTPATIENT_CLINIC_OR_DEPARTMENT_OTHER): Payer: Self-pay

## 2021-11-08 DIAGNOSIS — Z23 Encounter for immunization: Secondary | ICD-10-CM | POA: Diagnosis not present

## 2021-11-13 DIAGNOSIS — E7800 Pure hypercholesterolemia, unspecified: Secondary | ICD-10-CM | POA: Diagnosis not present

## 2021-12-25 DIAGNOSIS — Z1231 Encounter for screening mammogram for malignant neoplasm of breast: Secondary | ICD-10-CM | POA: Diagnosis not present

## 2021-12-25 DIAGNOSIS — M1611 Unilateral primary osteoarthritis, right hip: Secondary | ICD-10-CM | POA: Diagnosis not present

## 2021-12-25 DIAGNOSIS — M25551 Pain in right hip: Secondary | ICD-10-CM | POA: Diagnosis not present

## 2021-12-28 ENCOUNTER — Emergency Department (HOSPITAL_BASED_OUTPATIENT_CLINIC_OR_DEPARTMENT_OTHER): Payer: Medicare HMO

## 2021-12-28 ENCOUNTER — Emergency Department (HOSPITAL_BASED_OUTPATIENT_CLINIC_OR_DEPARTMENT_OTHER)
Admission: EM | Admit: 2021-12-28 | Discharge: 2021-12-28 | Disposition: A | Discharge status: Home / Self Care | Payer: Medicare HMO | Attending: Emergency Medicine | Admitting: Emergency Medicine

## 2021-12-28 DIAGNOSIS — R112 Nausea with vomiting, unspecified: Secondary | ICD-10-CM | POA: Insufficient documentation

## 2021-12-28 DIAGNOSIS — R4182 Altered mental status, unspecified: Secondary | ICD-10-CM | POA: Diagnosis not present

## 2021-12-28 DIAGNOSIS — R42 Dizziness and giddiness: Secondary | ICD-10-CM | POA: Insufficient documentation

## 2021-12-28 LAB — CBC
HCT: 40.1 % (ref 36.0–46.0)
Hemoglobin: 13.1 g/dL (ref 12.0–15.0)
MCH: 29.5 pg (ref 26.0–34.0)
MCHC: 32.7 g/dL (ref 30.0–36.0)
MCV: 90.3 fL (ref 80.0–100.0)
Platelets: 253 10*3/uL (ref 150–400)
RBC: 4.44 MIL/uL (ref 3.87–5.11)
RDW: 13.9 % (ref 11.5–15.5)
WBC: 8.5 10*3/uL (ref 4.0–10.5)
nRBC: 0 % (ref 0.0–0.2)

## 2021-12-28 LAB — URINALYSIS, ROUTINE W REFLEX MICROSCOPIC
Bilirubin Urine: NEGATIVE
Glucose, UA: NEGATIVE mg/dL
Hgb urine dipstick: NEGATIVE
Ketones, ur: NEGATIVE mg/dL
Leukocytes,Ua: NEGATIVE
Nitrite: NEGATIVE
Specific Gravity, Urine: 1.03 (ref 1.005–1.030)
pH: 6 (ref 5.0–8.0)

## 2021-12-28 LAB — BASIC METABOLIC PANEL
Anion gap: 8 (ref 5–15)
BUN: 20 mg/dL (ref 8–23)
CO2: 28 mmol/L (ref 22–32)
Calcium: 9.5 mg/dL (ref 8.9–10.3)
Chloride: 102 mmol/L (ref 98–111)
Creatinine, Ser: 0.67 mg/dL (ref 0.44–1.00)
GFR, Estimated: >60 mL/min (ref >60)
Glucose, Bld: 126 mg/dL — ABNORMAL HIGH (ref 70–99)
Potassium: 3.8 mmol/L (ref 3.5–5.1)
Sodium: 138 mmol/L (ref 135–145)

## 2021-12-28 MED ORDER — MECLIZINE HCL 25 MG PO TABS: 25.0000 mg | ORAL_TABLET | Freq: Three times a day (TID) | ORAL | 0 refills | Status: DC | PRN

## 2021-12-28 NOTE — ED Provider Notes (Signed)
[facility] EMERGENCY DEPT Provider Note   CSN: [id number] Arrival date & time: 12/28/21  1048     History  Chief Complaint  Patient presents with   Dizziness    Kristy Haynes is a 71 y.o. female.  She is here with a complaint of dizziness lightheadedness unsteadiness for the last 3 days.  Symptoms initially started improved and then recurred again today.  She had a similar symptom many years ago associate with nausea and vomiting.  Currently no nausea vomiting vision changes headache numbness or weakness.  No recent head injuries.  She is not on blood thinners.  No prior history of stroke.  Symptoms are worse with head movement  The history is provided by the patient.  Dizziness Quality:  Imbalance and room spinning Severity:  Moderate Onset quality:  Gradual Duration:  3 days Timing:  Intermittent Progression:  Unchanged Chronicity:  Recurrent Context: head movement   Relieved by:  Being still Worsened by:  Movement and turning head Ineffective treatments:  None tried Associated symptoms: tinnitus   Associated symptoms: no chest pain, no diarrhea, no headaches, no hearing loss, no nausea, no shortness of breath, no vision changes, no vomiting and no weakness        Home Medications Prior to Admission medications   Medication Sig Start Date End Date Taking? Authorizing Provider  meclizine (ANTIVERT) 25 MG tablet Take 1 tablet (25 mg total) by mouth 3 (three) times daily as needed for dizziness. 12/28/21  Yes Hayden Rasmussen, MD  ALPRAZolam Duanne Moron) 0.25 MG tablet TAKE 1 TABLET BY MOUTH EVERY DAY AS NEEDED FOR ANXIETY 08/05/21   Megan Salon, MD  Ascorbic Acid (VITAMIN C) 100 MG tablet daily.    [provider]  atorvastatin (LIPITOR) 20 MG tablet Take 10 mg by mouth daily.     [provider]  CALCIUM-MAGNESIUM-VITAMIN D PO Take by mouth daily. doTerra    [provider]  Multiple Vitamin (MULTI-VITAMIN PO) Take by mouth  daily.    [provider]  Zinc Sulfate (ZINC 15 PO) Take by mouth.    [provider]      Allergies    Codeine, Demerol [meperidine], and Hydrocodone    Review of Systems   Review of Systems  Constitutional:  Negative for fever.  HENT:  Positive for tinnitus. Negative for hearing loss and sore throat.   Eyes:  Negative for visual disturbance.  Respiratory:  Negative for shortness of breath.   Cardiovascular:  Negative for chest pain.  Gastrointestinal:  Negative for abdominal pain, diarrhea, nausea and vomiting.  Genitourinary:  Negative for dysuria.  Skin:  Negative for rash.  Neurological:  Positive for dizziness. Negative for weakness and headaches.    Physical Exam Updated Vital Signs BP 137/74   Pulse 66   Temp 97.6 F (36.4 C)   Resp 15   LMP 01/06/1999   SpO2 100%  Physical Exam Vitals and nursing note reviewed.  Constitutional:      General: She is not in acute distress.    Appearance: Normal appearance. She is well-developed.  HENT:     Head: Normocephalic and atraumatic.     Right Ear: Tympanic membrane, ear canal and external ear normal.     Left Ear: Tympanic membrane, ear canal and external ear normal.     Nose: Nose normal.     Mouth/Throat:     Mouth: Mucous membranes are moist.     Pharynx: Oropharynx is clear.  Eyes:  Extraocular Movements: Extraocular movements intact.     Conjunctiva/sclera: Conjunctivae normal.     Pupils: Pupils are equal, round, and reactive to light.  Cardiovascular:     Rate and Rhythm: Normal rate and regular rhythm.     Heart sounds: No murmur heard. Pulmonary:     Effort: Pulmonary effort is normal. No respiratory distress.     Breath sounds: Normal breath sounds.  Abdominal:     Palpations: Abdomen is soft.     Tenderness: There is no abdominal tenderness.  Musculoskeletal:        General: No deformity. Normal range of motion.     Cervical back: Neck supple.  Skin:    General: Skin is warm  and dry.     Capillary Refill: Capillary refill takes less than 2 seconds.  Neurological:     General: No focal deficit present.     Mental Status: She is alert and oriented to person, place, and time.     Cranial Nerves: No cranial nerve deficit.     Sensory: No sensory deficit.     Motor: No weakness.     Coordination: Coordination normal.     Gait: Gait normal.     ED Results / Procedures / Treatments   Labs (all labs ordered are listed, but only abnormal results are displayed) Labs Reviewed  BASIC METABOLIC PANEL - Abnormal; Notable for the following components:      Result Value   Glucose, Bld 126 (*)    All other components within normal limits  URINALYSIS, ROUTINE W REFLEX MICROSCOPIC - Abnormal; Notable for the following components:   Protein, ur TRACE (*)    All other components within normal limits  CBC    EKG EKG Interpretation  Date/Time:  Sunday December 28 2021 11:45:48 EST Ventricular Rate:  73 PR Interval:  156 QRS Duration: 80 QT Interval:  388 QTC Calculation: 427 R Axis:   -14 Text Interpretation: Normal sinus rhythm Minimal voltage criteria for LVH, may be normal variant ( R in aVL ) Borderline ECG No previous ECGs available Confirmed by Aletta Edouard [phone]) on 12/28/2021 2:41:05 PM  Radiology CT Head Wo Contrast  Result Date: 12/28/2021 CLINICAL DATA:  Mental status change, unknown cause dizzy, unsteady EXAM: CT HEAD WITHOUT CONTRAST TECHNIQUE: Contiguous axial images were obtained from the base of the skull through the vertex without intravenous contrast. RADIATION DOSE REDUCTION: This exam was performed according to the departmental dose-optimization program which includes automated exposure control, adjustment of the mA and/or kV according to patient size and/or use of iterative reconstruction technique. COMPARISON:  None Available. FINDINGS: Brain: No evidence of acute infarction, hemorrhage, hydrocephalus, extra-axial collection or mass  lesion/mass effect. Vascular: No hyperdense vessel or unexpected calcification. Skull: Normal. Negative for fracture or focal lesion. Sinuses/Orbits: No acute finding. Other: None. IMPRESSION: No acute intracranial abnormality. Electronically Signed   By: Davina Poke D.O.   On: 12/28/2021 14:21    Procedures Procedures    Medications Ordered in ED Medications - No data to display  ED Course/ Medical Decision Making/ A&P                           Medical Decision Making Amount and/or Complexity of Data Reviewed Labs: ordered. Radiology: ordered.   This patient complains of dizziness unsteadiness; this involves an extensive number of treatment Options and is a complaint that carries with it a high risk of complications and morbidity. The differential  includes vertigo, stroke, bleed, tumor, ear infection  I ordered, reviewed and interpreted labs, which included CBC with normal white count normal hemoglobin normal chemistries other than mildly elevated glucose, urinalysis without signs of infection I ordered imaging studies which included CT head and I independently    visualized and interpreted imaging which showed no acute findings Additional history obtained from patient's daughter Previous records obtained and reviewed in epic no recent admissions Cardiac monitoring reviewed, normal sinus rhythm Social determinants considered, no significant barriers Critical Interventions: None  After the interventions stated above, I reevaluated the patient and found patient to be neuro intact without any symptoms Admission and further testing considered, no indications for admission or further workup at this time.  Will treat empirically with meclizine and recommended close PCP follow-up return instructions discussed         Final Clinical Impression(s) / ED Diagnoses Final diagnoses:  Dizziness    Rx / DC Orders ED Discharge Orders          Ordered    meclizine (ANTIVERT) 25  MG tablet  3 times daily PRN        12/28/21 1430              Hayden Rasmussen, MD 12/28/21 1849

## 2021-12-28 NOTE — ED Notes (Signed)
Pt unable to provide urine sample at this time. Specimen cup provided to pt and advised to give sample when able.

## 2021-12-28 NOTE — ED Triage Notes (Signed)
Pt c/o dizziness for 3 days, started with a "fluttering" feeling in her left ear.  Denies n/v/d, vision changes or headache.  Dizziness comes and goes.

## 2021-12-28 NOTE — Discharge Instructions (Signed)
You were seen in the emergency department for evaluation of dizziness.  You had lab work urinalysis and a CAT scan of your head that did not show an obvious explanation for your symptoms.  This is possibly vertigo and will usually improve on its own.  We are prescribing you meclizine which may help lessen your symptoms.  Please follow-up with your primary care doctor.  Return to the emergency department if any worsening or concerning symptoms

## 2022-01-06 DIAGNOSIS — N6002 Solitary cyst of left breast: Secondary | ICD-10-CM | POA: Diagnosis not present

## 2022-01-06 DIAGNOSIS — R928 Other abnormal and inconclusive findings on diagnostic imaging of breast: Secondary | ICD-10-CM | POA: Diagnosis not present

## 2022-01-08 DIAGNOSIS — M25551 Pain in right hip: Secondary | ICD-10-CM | POA: Diagnosis not present

## 2022-02-05 DIAGNOSIS — M1611 Unilateral primary osteoarthritis, right hip: Secondary | ICD-10-CM | POA: Diagnosis not present

## 2022-04-10 DIAGNOSIS — K5730 Diverticulosis of large intestine without perforation or abscess without bleeding: Secondary | ICD-10-CM | POA: Diagnosis not present

## 2022-04-10 DIAGNOSIS — K648 Other hemorrhoids: Secondary | ICD-10-CM | POA: Diagnosis not present

## 2022-04-10 DIAGNOSIS — Z1211 Encounter for screening for malignant neoplasm of colon: Secondary | ICD-10-CM | POA: Diagnosis not present

## 2022-04-14 DIAGNOSIS — M1611 Unilateral primary osteoarthritis, right hip: Secondary | ICD-10-CM | POA: Diagnosis not present

## 2022-04-18 DIAGNOSIS — M25551 Pain in right hip: Secondary | ICD-10-CM | POA: Diagnosis not present

## 2022-05-28 ENCOUNTER — Encounter (HOSPITAL_BASED_OUTPATIENT_CLINIC_OR_DEPARTMENT_OTHER): Payer: Self-pay | Admitting: Obstetrics & Gynecology

## 2022-05-28 ENCOUNTER — Ambulatory Visit (INDEPENDENT_AMBULATORY_CARE_PROVIDER_SITE_OTHER): Payer: Medicare HMO | Admitting: Obstetrics & Gynecology

## 2022-05-28 ENCOUNTER — Other Ambulatory Visit (HOSPITAL_COMMUNITY)
Admission: RE | Admit: 2022-05-28 | Discharge: 2022-05-28 | Disposition: A | Discharge status: Home / Self Care | Payer: Medicare HMO | Source: Ambulatory Visit | Attending: Obstetrics & Gynecology | Admitting: Obstetrics & Gynecology

## 2022-05-28 VITALS — BP 116/74 | HR 90 | Ht 66.0 in | Wt 129.2 lb

## 2022-05-28 DIAGNOSIS — M810 Age-related osteoporosis without current pathological fracture: Secondary | ICD-10-CM

## 2022-05-28 DIAGNOSIS — Z124 Encounter for screening for malignant neoplasm of cervix: Secondary | ICD-10-CM | POA: Diagnosis not present

## 2022-05-28 DIAGNOSIS — F17200 Nicotine dependence, unspecified, uncomplicated: Secondary | ICD-10-CM | POA: Diagnosis not present

## 2022-05-28 DIAGNOSIS — Z01419 Encounter for gynecological examination (general) (routine) without abnormal findings: Secondary | ICD-10-CM | POA: Diagnosis not present

## 2022-05-28 DIAGNOSIS — F419 Anxiety disorder, unspecified: Secondary | ICD-10-CM

## 2022-05-28 MED ORDER — ALPRAZOLAM 0.25 MG PO TABS: ORAL_TABLET | ORAL | 0 refills | Status: DC

## 2022-05-28 NOTE — Progress Notes (Signed)
72 y.o. G3P3 Divorced White or Caucasian female here for breast and pelvic exam.    Denies vaginal bleeding.  H/o osteoporosis.  She is on bone support vitamin.  She has not had repeat yet.  Did not want additional treatment after last test.  Dr. Valentina Lucks was going to order this but she has retired.  Has not seen new PCP yet.  Order placed today.  Uses 1/2 tablet of 0.25mg  xanax rarely for anxiety at night.  Patient's last menstrual period was 01/06/1999.          Sexually active: No.  H/O STD:  no  Health Maintenance: PCP:  Will see new PCP.  Last wellness appt is planned for next week.  Will have blood work. Vaccines are up to date:  has not had shingles vaccination Colonoscopy:  03/2012 with Dr. Madilyn Fireman.  Follow up 10 years was recommended.  Was scheduled and did complete prep but was told she wasn't clean so has to do a longer prep.  Is scheduled 06/12/2022.   MMG:  2021.  Pt reports has done them yearly BMD:  06/29/2018 Last pap smear:  05/01/2020 Negative.   H/o abnormal pap smear:  no    reports that she has been smoking cigarettes. She has been smoking an average of .5 packs per day. She has never used smokeless tobacco. She reports current alcohol use. She reports that she does not use drugs.  Past Medical History:  Diagnosis Date   Abnormal uterine bleeding    x2 after childbirth x1   Anemia    Anxiety    Arthritis    right knee/using Voltaren gel   Breast discharge    Bilateral 25 years ago   Carpal tunnel syndrome, bilateral    Elevated lipids    Fibroid    Irregular heart beat    echo/stress test  (neg) 02/2008   Melanoma in situ of face ([facility])    nose   Osteopenia    Osteopenia    Positive TB test    x-ray normal   Transfusion history 1977    Past Surgical History:  Procedure Laterality Date   BACK SURGERY  1986   CARPAL TUNNEL RELEASE Right    01/22/2018   DILATION AND CURETTAGE OF UTERUS     x3   HERNIA REPAIR Bilateral    KNEE SURGERY Right 04/2015    LAPAROSCOPY  1998   SKIN BIOPSY     on nose   TUBAL LIGATION      Current Outpatient Medications  Medication Sig Dispense Refill   Ascorbic Acid (VITAMIN C) 100 MG tablet daily.     atorvastatin (LIPITOR) 20 MG tablet Take 10 mg by mouth daily.      CALCIUM-MAGNESIUM-VITAMIN D PO Take by mouth daily. doTerra     Multiple Vitamin (MULTI-VITAMIN PO) Take by mouth daily.     Zinc Sulfate (ZINC 15 PO) Take by mouth.     ALPRAZolam (XANAX) 0.25 MG tablet Take 1/2 tab prn at night for anxiety/insomnia 30 tablet 0   No current facility-administered medications for this visit.    Family History  Problem Relation Age of Onset   Heart attack Maternal Grandmother     Review of Systems  Constitutional: Negative.   Genitourinary: Negative.     Exam:   BP 116/74 (BP Location: Left Arm, Patient Position: Sitting, Cuff Size: Normal)   Pulse 90   Ht 5\' 6"  (1.676 m) Comment: Reported  Wt 129 lb 3.2 oz (  58.6 kg)   LMP 01/06/1999   BMI 20.85 kg/m   Height: 5\' 6"  (167.6 cm) (Reported)  General appearance: alert, cooperative and appears stated age Breasts: normal appearance, no masses or tenderness Abdomen: soft, non-tender; bowel sounds normal; no masses,  no organomegaly Lymph nodes: Cervical, supraclavicular, and axillary nodes normal.  No abnormal inguinal nodes palpated Neurologic: Grossly normal  Pelvic: External genitalia:  no lesions              Urethra:  normal appearing urethra with no masses, tenderness or lesions              Bartholins and Skenes: normal                 Vagina: normal appearing vagina with atrophic changes and no discharge, no lesions              Cervix: no lesions              Pap taken: Yes.   Bimanual Exam:  Uterus:  normal size, contour, position, consistency, mobility, non-tender              Adnexa: normal adnexa and no mass, fullness, tenderness               Rectovaginal: Confirms               Anus:  normal sphincter tone, no  lesions  Chaperone, Ina Homes, CMA, was present for exam.  Assessment/Plan: 1. Encntr for gyn exam (general) (routine) w/o abn findings - Pap smear obtained today - Mammogram is up to date per pt.   - Colonoscopy is scheduled for early June - Bone mineral density ordered - lab work done will be done with new PCP in June - vaccines reviewed/updated  2. Age-related osteoporosis without current pathological fracture - DG BONE DENSITY (DXA); Future  3. Cervical cancer screening - Cytology - PAP( [place]) - PR OBTAINING SCREEN PAP SMEAR  4. Anxiety - ALPRAZolam (XANAX) 0.25 MG tablet; Take 1/2 tab prn at night for anxiety/insomnia  Dispense: 30 tablet; Refill: 0  5. Smoker - CT screening for lung cancer offered.  Pt declined.

## 2022-06-03 LAB — CYTOLOGY - PAP
Adequacy: ABSENT
Diagnosis: NEGATIVE

## 2022-06-05 DIAGNOSIS — L209 Atopic dermatitis, unspecified: Secondary | ICD-10-CM | POA: Diagnosis not present

## 2022-06-05 DIAGNOSIS — Z720 Tobacco use: Secondary | ICD-10-CM | POA: Diagnosis not present

## 2022-06-05 DIAGNOSIS — F419 Anxiety disorder, unspecified: Secondary | ICD-10-CM | POA: Diagnosis not present

## 2022-06-05 DIAGNOSIS — M810 Age-related osteoporosis without current pathological fracture: Secondary | ICD-10-CM | POA: Diagnosis not present

## 2022-06-05 DIAGNOSIS — M1611 Unilateral primary osteoarthritis, right hip: Secondary | ICD-10-CM | POA: Diagnosis not present

## 2022-06-05 DIAGNOSIS — Z136 Encounter for screening for cardiovascular disorders: Secondary | ICD-10-CM | POA: Diagnosis not present

## 2022-06-05 DIAGNOSIS — Z85820 Personal history of malignant melanoma of skin: Secondary | ICD-10-CM | POA: Diagnosis not present

## 2022-06-05 DIAGNOSIS — Z0000 Encounter for general adult medical examination without abnormal findings: Secondary | ICD-10-CM | POA: Diagnosis not present

## 2022-06-05 DIAGNOSIS — E7800 Pure hypercholesterolemia, unspecified: Secondary | ICD-10-CM | POA: Diagnosis not present

## 2022-06-05 DIAGNOSIS — M1990 Unspecified osteoarthritis, unspecified site: Secondary | ICD-10-CM | POA: Diagnosis not present

## 2022-06-05 DIAGNOSIS — N951 Menopausal and female climacteric states: Secondary | ICD-10-CM | POA: Diagnosis not present

## 2022-06-12 DIAGNOSIS — K5730 Diverticulosis of large intestine without perforation or abscess without bleeding: Secondary | ICD-10-CM | POA: Diagnosis not present

## 2022-06-12 DIAGNOSIS — K635 Polyp of colon: Secondary | ICD-10-CM | POA: Diagnosis not present

## 2022-06-12 DIAGNOSIS — K648 Other hemorrhoids: Secondary | ICD-10-CM | POA: Diagnosis not present

## 2022-06-12 DIAGNOSIS — Z1211 Encounter for screening for malignant neoplasm of colon: Secondary | ICD-10-CM | POA: Diagnosis not present

## 2022-06-17 DIAGNOSIS — K635 Polyp of colon: Secondary | ICD-10-CM | POA: Diagnosis not present

## 2022-07-08 ENCOUNTER — Encounter (HOSPITAL_BASED_OUTPATIENT_CLINIC_OR_DEPARTMENT_OTHER): Payer: Self-pay | Admitting: *Deleted

## 2022-07-16 DIAGNOSIS — D229 Melanocytic nevi, unspecified: Secondary | ICD-10-CM | POA: Diagnosis not present

## 2022-07-16 DIAGNOSIS — D1801 Hemangioma of skin and subcutaneous tissue: Secondary | ICD-10-CM | POA: Diagnosis not present

## 2022-07-16 DIAGNOSIS — L821 Other seborrheic keratosis: Secondary | ICD-10-CM | POA: Diagnosis not present

## 2022-07-16 DIAGNOSIS — L820 Inflamed seborrheic keratosis: Secondary | ICD-10-CM | POA: Diagnosis not present

## 2022-07-16 DIAGNOSIS — L814 Other melanin hyperpigmentation: Secondary | ICD-10-CM | POA: Diagnosis not present

## 2022-07-16 DIAGNOSIS — Z86006 Personal history of melanoma in-situ: Secondary | ICD-10-CM | POA: Diagnosis not present

## 2022-07-16 DIAGNOSIS — L578 Other skin changes due to chronic exposure to nonionizing radiation: Secondary | ICD-10-CM | POA: Diagnosis not present

## 2022-07-16 DIAGNOSIS — B078 Other viral warts: Secondary | ICD-10-CM | POA: Diagnosis not present

## 2022-07-16 DIAGNOSIS — L570 Actinic keratosis: Secondary | ICD-10-CM | POA: Diagnosis not present

## 2022-07-22 ENCOUNTER — Ambulatory Visit (HOSPITAL_BASED_OUTPATIENT_CLINIC_OR_DEPARTMENT_OTHER)
Admission: RE | Admit: 2022-07-22 | Discharge: 2022-07-22 | Disposition: A | Discharge status: Home / Self Care | Payer: Medicare HMO | Source: Ambulatory Visit | Attending: Obstetrics & Gynecology | Admitting: Obstetrics & Gynecology

## 2022-07-22 DIAGNOSIS — M810 Age-related osteoporosis without current pathological fracture: Secondary | ICD-10-CM | POA: Insufficient documentation

## 2022-07-22 DIAGNOSIS — Z780 Asymptomatic menopausal state: Secondary | ICD-10-CM | POA: Diagnosis not present

## 2022-08-15 DIAGNOSIS — F419 Anxiety disorder, unspecified: Secondary | ICD-10-CM | POA: Diagnosis not present

## 2022-08-15 DIAGNOSIS — Z91199 Patient's noncompliance with other medical treatment and regimen due to unspecified reason: Secondary | ICD-10-CM | POA: Diagnosis not present

## 2022-08-15 DIAGNOSIS — Z8249 Family history of ischemic heart disease and other diseases of the circulatory system: Secondary | ICD-10-CM | POA: Diagnosis not present

## 2022-08-15 DIAGNOSIS — Z973 Presence of spectacles and contact lenses: Secondary | ICD-10-CM | POA: Diagnosis not present

## 2022-08-15 DIAGNOSIS — Z9841 Cataract extraction status, right eye: Secondary | ICD-10-CM | POA: Diagnosis not present

## 2022-08-15 DIAGNOSIS — M1990 Unspecified osteoarthritis, unspecified site: Secondary | ICD-10-CM | POA: Diagnosis not present

## 2022-08-15 DIAGNOSIS — E785 Hyperlipidemia, unspecified: Secondary | ICD-10-CM | POA: Diagnosis not present

## 2022-08-15 DIAGNOSIS — M810 Age-related osteoporosis without current pathological fracture: Secondary | ICD-10-CM | POA: Diagnosis not present

## 2022-08-15 DIAGNOSIS — Z85828 Personal history of other malignant neoplasm of skin: Secondary | ICD-10-CM | POA: Diagnosis not present

## 2022-08-15 DIAGNOSIS — F17210 Nicotine dependence, cigarettes, uncomplicated: Secondary | ICD-10-CM | POA: Diagnosis not present

## 2022-08-24 DIAGNOSIS — H524 Presbyopia: Secondary | ICD-10-CM | POA: Diagnosis not present

## 2022-08-24 DIAGNOSIS — H35363 Drusen (degenerative) of macula, bilateral: Secondary | ICD-10-CM | POA: Diagnosis not present

## 2022-10-03 DIAGNOSIS — M25551 Pain in right hip: Secondary | ICD-10-CM | POA: Diagnosis not present

## 2022-10-17 DIAGNOSIS — Z23 Encounter for immunization: Secondary | ICD-10-CM | POA: Diagnosis not present

## 2022-11-17 DIAGNOSIS — M1611 Unilateral primary osteoarthritis, right hip: Secondary | ICD-10-CM | POA: Diagnosis not present

## 2022-12-31 DIAGNOSIS — Z1231 Encounter for screening mammogram for malignant neoplasm of breast: Secondary | ICD-10-CM | POA: Diagnosis not present

## 2023-01-06 HISTORY — PX: OTHER SURGICAL HISTORY: SHX169

## 2023-01-08 ENCOUNTER — Encounter (HOSPITAL_BASED_OUTPATIENT_CLINIC_OR_DEPARTMENT_OTHER): Payer: Self-pay | Admitting: *Deleted

## 2023-01-12 ENCOUNTER — Other Ambulatory Visit (HOSPITAL_BASED_OUTPATIENT_CLINIC_OR_DEPARTMENT_OTHER): Payer: Self-pay | Admitting: Obstetrics & Gynecology

## 2023-01-12 DIAGNOSIS — F419 Anxiety disorder, unspecified: Secondary | ICD-10-CM

## 2023-01-12 MED ORDER — ALPRAZOLAM 0.25 MG PO TABS: ORAL_TABLET | ORAL | 0 refills | Status: DC

## 2023-01-15 DIAGNOSIS — M1611 Unilateral primary osteoarthritis, right hip: Secondary | ICD-10-CM | POA: Diagnosis not present

## 2023-02-01 DIAGNOSIS — Z96641 Presence of right artificial hip joint: Secondary | ICD-10-CM | POA: Diagnosis not present

## 2023-02-01 DIAGNOSIS — Z471 Aftercare following joint replacement surgery: Secondary | ICD-10-CM | POA: Diagnosis not present

## 2023-03-01 DIAGNOSIS — Z471 Aftercare following joint replacement surgery: Secondary | ICD-10-CM | POA: Diagnosis not present

## 2023-03-01 DIAGNOSIS — Z96641 Presence of right artificial hip joint: Secondary | ICD-10-CM | POA: Diagnosis not present

## 2023-03-17 DIAGNOSIS — Z818 Family history of other mental and behavioral disorders: Secondary | ICD-10-CM | POA: Diagnosis not present

## 2023-03-17 DIAGNOSIS — R030 Elevated blood-pressure reading, without diagnosis of hypertension: Secondary | ICD-10-CM | POA: Diagnosis not present

## 2023-03-17 DIAGNOSIS — E785 Hyperlipidemia, unspecified: Secondary | ICD-10-CM | POA: Diagnosis not present

## 2023-03-17 DIAGNOSIS — Z96649 Presence of unspecified artificial hip joint: Secondary | ICD-10-CM | POA: Diagnosis not present

## 2023-03-17 DIAGNOSIS — Z5986 Financial insecurity: Secondary | ICD-10-CM | POA: Diagnosis not present

## 2023-03-17 DIAGNOSIS — Z85820 Personal history of malignant melanoma of skin: Secondary | ICD-10-CM | POA: Diagnosis not present

## 2023-03-17 DIAGNOSIS — R7303 Prediabetes: Secondary | ICD-10-CM | POA: Diagnosis not present

## 2023-03-17 DIAGNOSIS — M1990 Unspecified osteoarthritis, unspecified site: Secondary | ICD-10-CM | POA: Diagnosis not present

## 2023-03-17 DIAGNOSIS — Z5948 Other specified lack of adequate food: Secondary | ICD-10-CM | POA: Diagnosis not present

## 2023-03-17 DIAGNOSIS — F419 Anxiety disorder, unspecified: Secondary | ICD-10-CM | POA: Diagnosis not present

## 2023-03-17 DIAGNOSIS — F17210 Nicotine dependence, cigarettes, uncomplicated: Secondary | ICD-10-CM | POA: Diagnosis not present

## 2023-07-15 ENCOUNTER — Encounter (HOSPITAL_BASED_OUTPATIENT_CLINIC_OR_DEPARTMENT_OTHER): Payer: Self-pay | Admitting: Obstetrics & Gynecology

## 2023-07-15 ENCOUNTER — Ambulatory Visit (HOSPITAL_BASED_OUTPATIENT_CLINIC_OR_DEPARTMENT_OTHER): Admitting: Obstetrics & Gynecology

## 2023-07-15 VITALS — BP 130/69 | HR 73 | Wt 121.4 lb

## 2023-07-15 DIAGNOSIS — R7303 Prediabetes: Secondary | ICD-10-CM

## 2023-07-15 DIAGNOSIS — R8290 Unspecified abnormal findings in urine: Secondary | ICD-10-CM | POA: Diagnosis not present

## 2023-07-15 DIAGNOSIS — N3000 Acute cystitis without hematuria: Secondary | ICD-10-CM

## 2023-07-15 LAB — POCT URINALYSIS DIPSTICK
Bilirubin, UA: NEGATIVE
Blood, UA: NEGATIVE
Glucose, UA: NEGATIVE
Ketones, UA: NEGATIVE
Leukocytes, UA: NEGATIVE
Nitrite, UA: POSITIVE
Protein, UA: NEGATIVE
Spec Grav, UA: 1.015 (ref 1.010–1.025)
Urobilinogen, UA: 0.2 U/dL
pH, UA: 6 (ref 5.0–8.0)

## 2023-07-15 MED ORDER — NITROFURANTOIN MONOHYD MACRO 100 MG PO CAPS: 100.0000 mg | ORAL_CAPSULE | Freq: Two times a day (BID) | ORAL | 0 refills | Status: DC

## 2023-07-15 NOTE — Progress Notes (Signed)
 GYNECOLOGY  VISIT  CC:   weigh loss, hba1c, urine odor  HPI: 73 y.o. G3P3 Divorced White or Caucasian female here for concerns about prediabetes that was diagnosed prior to her recent right hip replacement done 01/2023.  She's done well from a surgical standpoint.  She primary used ibuprofen and arthritis tylenol for post op pain.  Now, not taking anything.  Hb A1c was 6.3 prior to surgery.  She made some dietary changes and specifically stopped  and she had it repeated in February and it was 5.6.  Then she had it repeated again and it was 6.3 again.  She's feeling a lot of stress with this.  She is having trouble gaining wait after her surgery and dietary changes.  She's down about 8 pounds since I saw her last.  She has an appt with a nutritionist with [facility] and the appointment is 08/02/2023.  Would like to see someone sooner if possible.    Also having foul smelling urine.  Not having dysuria but it is cloudy as well.  Denies any hematuria.  No vaginal bleeding.     Past Medical History:  Diagnosis Date   Abnormal uterine bleeding    x2 after childbirth x1   Anemia    Anxiety    Arthritis    right knee/using Voltaren gel   Breast discharge    Bilateral 25 years ago   Carpal tunnel syndrome, bilateral    Elevated lipids    Fibroid    Irregular heart beat    echo/stress test  (neg) 02/2008   Melanoma in situ of face (HCC)    nose   Osteopenia    Osteopenia    Positive TB test    x-ray normal   Transfusion history 1977    MEDS:   Current Outpatient Medications on File Prior to Visit  Medication Sig Dispense Refill   ALPRAZolam  (XANAX ) 0.25 MG tablet Take 1/2 tab prn at night for anxiety/insomnia 30 tablet 0   Ascorbic Acid (VITAMIN C) 100 MG tablet daily.     atorvastatin (LIPITOR) 20 MG tablet Take 10 mg by mouth daily.      Multiple Vitamin (MULTI-VITAMIN PO) Take by mouth daily.     Zinc Sulfate (ZINC 15 PO) Take by mouth.     CALCIUM-MAGNESIUM-VITAMIN D PO Take by mouth  daily. doTerra     No current facility-administered medications on file prior to visit.    ALLERGIES: Codeine, Demerol [meperidine], and Hydrocodone  SH:  divorced, non smoker  Review of Systems  Constitutional: Negative.   Genitourinary:  Negative for dysuria, flank pain and urgency.    PHYSICAL EXAMINATION:    BP 130/69 (BP Location: Right Arm, Patient Position: Sitting)   Pulse 73   Wt 121 lb 6.4 oz (55.1 kg)   LMP 01/06/1999   SpO2 100%   BMI 19.59 kg/m     Physical Exam Constitutional:      Appearance: Normal appearance.  Neurological:     General: No focal deficit present.     Mental Status: She is alert.  Psychiatric:        Mood and Affect: Mood normal.        Behavior: Behavior normal.    Assessment/Plan: 1. Abnormal urine odor (Primary) - Urine Culture - nitrofurantoin , macrocrystal-monohydrate, (MACROBID ) 100 MG capsule; Take 1 capsule (100 mg total) by mouth 2 (two) times daily.  Dispense: 10 capsule; Refill: 0 - POCT Urinalysis Dipstick  2. Acute cystitis without hematuria  3.  Prediabetes - will refer to nutritional counseling for appt if possible sooner - Referral to Nutrition and Diabetes Services

## 2023-07-18 ENCOUNTER — Encounter (HOSPITAL_BASED_OUTPATIENT_CLINIC_OR_DEPARTMENT_OTHER): Payer: Self-pay | Admitting: Obstetrics & Gynecology

## 2023-07-18 ENCOUNTER — Ambulatory Visit (HOSPITAL_BASED_OUTPATIENT_CLINIC_OR_DEPARTMENT_OTHER): Payer: Self-pay | Admitting: Obstetrics & Gynecology

## 2023-07-18 LAB — URINE CULTURE

## 2023-07-22 DIAGNOSIS — L814 Other melanin hyperpigmentation: Secondary | ICD-10-CM | POA: Diagnosis not present

## 2023-07-22 DIAGNOSIS — D2339 Other benign neoplasm of skin of other parts of face: Secondary | ICD-10-CM | POA: Diagnosis not present

## 2023-07-22 DIAGNOSIS — D485 Neoplasm of uncertain behavior of skin: Secondary | ICD-10-CM | POA: Diagnosis not present

## 2023-07-22 DIAGNOSIS — Z86006 Personal history of melanoma in-situ: Secondary | ICD-10-CM | POA: Diagnosis not present

## 2023-07-22 DIAGNOSIS — I781 Nevus, non-neoplastic: Secondary | ICD-10-CM | POA: Diagnosis not present

## 2023-07-22 DIAGNOSIS — L821 Other seborrheic keratosis: Secondary | ICD-10-CM | POA: Diagnosis not present

## 2023-07-22 DIAGNOSIS — L570 Actinic keratosis: Secondary | ICD-10-CM | POA: Diagnosis not present

## 2023-07-22 DIAGNOSIS — L578 Other skin changes due to chronic exposure to nonionizing radiation: Secondary | ICD-10-CM | POA: Diagnosis not present

## 2023-07-22 DIAGNOSIS — D229 Melanocytic nevi, unspecified: Secondary | ICD-10-CM | POA: Diagnosis not present

## 2023-07-23 DIAGNOSIS — Z471 Aftercare following joint replacement surgery: Secondary | ICD-10-CM | POA: Diagnosis not present

## 2023-07-23 DIAGNOSIS — Z96641 Presence of right artificial hip joint: Secondary | ICD-10-CM | POA: Diagnosis not present

## 2023-07-27 ENCOUNTER — Ambulatory Visit (HOSPITAL_BASED_OUTPATIENT_CLINIC_OR_DEPARTMENT_OTHER)

## 2023-07-27 ENCOUNTER — Encounter (HOSPITAL_BASED_OUTPATIENT_CLINIC_OR_DEPARTMENT_OTHER): Payer: Self-pay

## 2023-07-27 ENCOUNTER — Other Ambulatory Visit (HOSPITAL_BASED_OUTPATIENT_CLINIC_OR_DEPARTMENT_OTHER): Payer: Self-pay | Admitting: Obstetrics & Gynecology

## 2023-07-27 VITALS — BP 151/72 | HR 72 | Ht 66.0 in | Wt 120.4 lb

## 2023-07-27 DIAGNOSIS — R8290 Unspecified abnormal findings in urine: Secondary | ICD-10-CM

## 2023-07-27 LAB — POCT URINALYSIS DIPSTICK
Bilirubin, UA: NEGATIVE
Glucose, UA: NEGATIVE
Ketones, UA: NEGATIVE
Nitrite, UA: POSITIVE
Protein, UA: NEGATIVE
Spec Grav, UA: 1.025 (ref 1.010–1.025)
Urobilinogen, UA: 0.2 U/dL
pH, UA: 5.5 (ref 5.0–8.0)

## 2023-07-27 MED ORDER — SULFAMETHOXAZOLE-TRIMETHOPRIM 800-160 MG PO TABS: 1.0000 | ORAL_TABLET | Freq: Two times a day (BID) | ORAL | 0 refills | Status: DC

## 2023-07-27 NOTE — Progress Notes (Addendum)
 NURSE VISIT- UTI SYMPTOMS   SUBJECTIVE:  Kristy Haynes is a 73 y.o. G3P3 female here for UTI symptoms. She is a GYN patient. She reports abnormal urine odor.  OBJECTIVE:  BP (!) 151/72   Pulse 72   Ht 5' 6 (1.676 m) Comment: reported  Wt 120 lb 6.4 oz (54.6 kg)   LMP 01/06/1999   BMI 19.43 kg/m   Appears well, in no apparent distress  No results found for this or any previous visit (from the past 24 hours).  ASSESSMENT: GYN patient with UTI symptoms and positive nitrites  PLAN: Visit routed to or discussed with:  Rx sent today: bactrim  DS bid x 5 days. Urine culture sent Call or return to clinic prn if these symptoms worsen or fail to improve as anticipated. Follow-up: as needed

## 2023-07-29 LAB — URINE CULTURE

## 2023-07-30 ENCOUNTER — Ambulatory Visit (HOSPITAL_BASED_OUTPATIENT_CLINIC_OR_DEPARTMENT_OTHER): Payer: Self-pay | Admitting: Obstetrics & Gynecology

## 2023-08-12 ENCOUNTER — Ambulatory Visit (INDEPENDENT_AMBULATORY_CARE_PROVIDER_SITE_OTHER): Admitting: Obstetrics & Gynecology

## 2023-08-12 ENCOUNTER — Encounter (HOSPITAL_BASED_OUTPATIENT_CLINIC_OR_DEPARTMENT_OTHER): Payer: Self-pay

## 2023-08-12 VITALS — BP 135/67 | HR 65 | Ht 66.0 in | Wt 121.0 lb

## 2023-08-12 DIAGNOSIS — R8290 Unspecified abnormal findings in urine: Secondary | ICD-10-CM

## 2023-08-12 LAB — POC URINALSYSI DIPSTICK (AUTOMATED)
Bilirubin, UA: NEGATIVE
Glucose, UA: NEGATIVE
Ketones, UA: NEGATIVE
Nitrite, UA: NEGATIVE
Protein, UA: NEGATIVE
Spec Grav, UA: 1.01 (ref 1.010–1.025)
Urobilinogen, UA: 0.2 U/dL
pH, UA: 6 (ref 5.0–8.0)

## 2023-08-12 NOTE — Progress Notes (Signed)
 NURSE VISIT- UTI SYMPTOMS   SUBJECTIVE:  Kristy Haynes is a 73 y.o. G3P3 female here for UTI symptoms. She is a GYN patient. She reports strong odor.  OBJECTIVE:  LMP 01/06/1999   Appears well, in no apparent distress  No results found for this or any previous visit (from the past 24 hours).  ASSESSMENT: GYN patient with UTI symptoms and positive nitrites  PLAN: Visit routed to or discussed with:  Rx sent today: No Urine culture sent Call or return to clinic prn if these symptoms worsen or fail to improve as anticipated. Follow-up: as needed     Patient has been on Macrobid  07/15/23 and Bactrim  07/27/23. Did get some better but symptoms returned yesterday.

## 2023-08-13 ENCOUNTER — Encounter (HOSPITAL_BASED_OUTPATIENT_CLINIC_OR_DEPARTMENT_OTHER): Payer: Self-pay | Admitting: Obstetrics & Gynecology

## 2023-08-13 ENCOUNTER — Other Ambulatory Visit (HOSPITAL_BASED_OUTPATIENT_CLINIC_OR_DEPARTMENT_OTHER): Payer: Self-pay | Admitting: Obstetrics & Gynecology

## 2023-08-13 ENCOUNTER — Ambulatory Visit (HOSPITAL_BASED_OUTPATIENT_CLINIC_OR_DEPARTMENT_OTHER): Payer: Self-pay | Admitting: Obstetrics & Gynecology

## 2023-08-13 DIAGNOSIS — N390 Urinary tract infection, site not specified: Secondary | ICD-10-CM

## 2023-08-13 MED ORDER — CIPROFLOXACIN HCL 250 MG PO TABS: 250.0000 mg | ORAL_TABLET | Freq: Two times a day (BID) | ORAL | 0 refills | Status: AC

## 2023-08-13 MED ORDER — ESTRADIOL 0.1 MG/GM VA CREA: TOPICAL_CREAM | VAGINAL | 1 refills | Status: AC

## 2023-08-17 ENCOUNTER — Encounter (HOSPITAL_BASED_OUTPATIENT_CLINIC_OR_DEPARTMENT_OTHER): Payer: Self-pay | Admitting: Certified Nurse Midwife

## 2023-08-17 LAB — URINE CULTURE

## 2023-08-27 ENCOUNTER — Telehealth (HOSPITAL_BASED_OUTPATIENT_CLINIC_OR_DEPARTMENT_OTHER): Payer: Self-pay

## 2023-08-27 ENCOUNTER — Encounter (HOSPITAL_BASED_OUTPATIENT_CLINIC_OR_DEPARTMENT_OTHER): Payer: Self-pay

## 2023-08-27 ENCOUNTER — Ambulatory Visit (INDEPENDENT_AMBULATORY_CARE_PROVIDER_SITE_OTHER)

## 2023-08-27 VITALS — BP 121/67 | HR 70 | Ht 65.5 in | Wt 120.8 lb

## 2023-08-27 DIAGNOSIS — Z87440 Personal history of urinary (tract) infections: Secondary | ICD-10-CM

## 2023-08-27 DIAGNOSIS — R399 Unspecified symptoms and signs involving the genitourinary system: Secondary | ICD-10-CM | POA: Diagnosis not present

## 2023-08-27 LAB — POCT URINALYSIS DIPSTICK
Bilirubin, UA: NEGATIVE
Glucose, UA: NEGATIVE
Ketones, UA: NEGATIVE
Leukocytes, UA: NEGATIVE
Nitrite, UA: NEGATIVE
Protein, UA: NEGATIVE
Spec Grav, UA: 1.02 (ref 1.010–1.025)
Urobilinogen, UA: 0.2 U/dL
pH, UA: 6 (ref 5.0–8.0)

## 2023-08-27 NOTE — Telephone Encounter (Signed)
 Patient would like to know how long does she need to continue taking the probiotic and using the Estradiol  vaginal cream? Please advise.

## 2023-08-27 NOTE — Progress Notes (Signed)
 NURSE VISIT- UTI SYMPTOMS   SUBJECTIVE:  Kristy Haynes is a 73 y.o. G3P3 female here for UTI symptoms. She is a GYN patient. She reports she is here for repeat urine.  OBJECTIVE:  BP 121/67   Pulse 70   Ht 5' 5.5 (1.664 m)   Wt 120 lb 12.8 oz (54.8 kg)   LMP 01/06/1999   BMI 19.80 kg/m   Appears well, in no apparent distress  No results found for this or any previous visit (from the past 24 hours).  ASSESSMENT: Repeat urine and negative nitrites  PLAN: Visit routed to or discussed with:  Rx sent today: No Urine culture not sent Call or return to clinic prn if these symptoms worsen or fail to improve as anticipated. Follow-up: as needed

## 2023-10-12 ENCOUNTER — Encounter (HOSPITAL_BASED_OUTPATIENT_CLINIC_OR_DEPARTMENT_OTHER): Payer: Self-pay | Admitting: Obstetrics & Gynecology

## 2023-10-12 ENCOUNTER — Encounter (HOSPITAL_BASED_OUTPATIENT_CLINIC_OR_DEPARTMENT_OTHER): Payer: Self-pay

## 2023-10-12 NOTE — Telephone Encounter (Signed)
 Left message for patient to call back to office to discuss.  Kristy LOISE Quale, RN

## 2023-11-13 DIAGNOSIS — Z23 Encounter for immunization: Secondary | ICD-10-CM | POA: Diagnosis not present

## 2023-11-21 NOTE — Progress Notes (Signed)
 Cosign note in my inbasket today so completed.

## 2024-01-07 ENCOUNTER — Other Ambulatory Visit (HOSPITAL_BASED_OUTPATIENT_CLINIC_OR_DEPARTMENT_OTHER): Payer: Self-pay

## 2024-01-07 DIAGNOSIS — F419 Anxiety disorder, unspecified: Secondary | ICD-10-CM

## 2024-01-07 MED ORDER — ALPRAZOLAM 0.25 MG PO TABS: ORAL_TABLET | ORAL | 0 refills | Status: AC

## 2024-01-07 NOTE — Telephone Encounter (Signed)
 Patient called in today requesting a refill for Alprzolam 0.25mg . Please advise. tbw

## 2024-01-10 ENCOUNTER — Encounter (HOSPITAL_BASED_OUTPATIENT_CLINIC_OR_DEPARTMENT_OTHER): Payer: Self-pay
